# Patient Record
Sex: Female | Born: 1975 | Race: White | Hispanic: No | Smoking: Current some day smoker
Health system: Southern US, Community
[De-identification: ages and names within clinical notes are randomized; demographics above are authoritative.]

## PROBLEM LIST (undated history)

## (undated) DIAGNOSIS — K811 Chronic cholecystitis: Secondary | ICD-10-CM

## (undated) DIAGNOSIS — R140 Abdominal distension (gaseous): Secondary | ICD-10-CM

## (undated) DIAGNOSIS — R109 Unspecified abdominal pain: Secondary | ICD-10-CM

## (undated) DIAGNOSIS — K635 Polyp of colon: Secondary | ICD-10-CM

## (undated) DIAGNOSIS — R238 Other skin changes: Secondary | ICD-10-CM

## (undated) DIAGNOSIS — F419 Anxiety disorder, unspecified: Secondary | ICD-10-CM

## (undated) DIAGNOSIS — R079 Chest pain, unspecified: Secondary | ICD-10-CM

## (undated) DIAGNOSIS — T7840XA Allergy, unspecified, initial encounter: Secondary | ICD-10-CM

## (undated) DIAGNOSIS — N3010 Interstitial cystitis (chronic) without hematuria: Secondary | ICD-10-CM

## (undated) DIAGNOSIS — T8859XA Other complications of anesthesia, initial encounter: Secondary | ICD-10-CM

## (undated) DIAGNOSIS — S0300XA Dislocation of jaw, unspecified side, initial encounter: Secondary | ICD-10-CM

## (undated) DIAGNOSIS — M8580 Other specified disorders of bone density and structure, unspecified site: Secondary | ICD-10-CM

## (undated) DIAGNOSIS — B379 Candidiasis, unspecified: Secondary | ICD-10-CM

## (undated) DIAGNOSIS — K219 Gastro-esophageal reflux disease without esophagitis: Secondary | ICD-10-CM

## (undated) DIAGNOSIS — D126 Benign neoplasm of colon, unspecified: Secondary | ICD-10-CM

## (undated) DIAGNOSIS — K52839 Microscopic colitis, unspecified: Secondary | ICD-10-CM

## (undated) DIAGNOSIS — R519 Headache, unspecified: Secondary | ICD-10-CM

## (undated) DIAGNOSIS — K2970 Gastritis, unspecified, without bleeding: Secondary | ICD-10-CM

## (undated) DIAGNOSIS — R110 Nausea: Secondary | ICD-10-CM

## (undated) DIAGNOSIS — R51 Headache: Secondary | ICD-10-CM

## (undated) DIAGNOSIS — R233 Spontaneous ecchymoses: Secondary | ICD-10-CM

## (undated) DIAGNOSIS — N809 Endometriosis, unspecified: Secondary | ICD-10-CM

## (undated) DIAGNOSIS — T4145XA Adverse effect of unspecified anesthetic, initial encounter: Secondary | ICD-10-CM

## (undated) DIAGNOSIS — K5900 Constipation, unspecified: Secondary | ICD-10-CM

## (undated) DIAGNOSIS — R6883 Chills (without fever): Secondary | ICD-10-CM

## (undated) DIAGNOSIS — R0981 Nasal congestion: Secondary | ICD-10-CM

## (undated) DIAGNOSIS — R531 Weakness: Secondary | ICD-10-CM

## (undated) HISTORY — DX: Other skin changes: R23.8

## (undated) HISTORY — DX: Chest pain, unspecified: R07.9

## (undated) HISTORY — DX: Abdominal distension (gaseous): R14.0

## (undated) HISTORY — DX: Headache: R51

## (undated) HISTORY — DX: Unspecified abdominal pain: R10.9

## (undated) HISTORY — DX: Microscopic colitis, unspecified: K52.839

## (undated) HISTORY — DX: Gastritis, unspecified, without bleeding: K29.70

## (undated) HISTORY — DX: Polyp of colon: K63.5

## (undated) HISTORY — DX: Interstitial cystitis (chronic) without hematuria: N30.10

## (undated) HISTORY — PX: COLONOSCOPY: SHX174

## (undated) HISTORY — DX: Headache, unspecified: R51.9

## (undated) HISTORY — DX: Weakness: R53.1

## (undated) HISTORY — PX: WISDOM TOOTH EXTRACTION: SHX21

## (undated) HISTORY — DX: Benign neoplasm of colon, unspecified: D12.6

## (undated) HISTORY — PX: BILATERAL TEMPOROMANDIBULAR JOINT ARTHROPLASTY: SUR77

## (undated) HISTORY — DX: Nausea: R11.0

## (undated) HISTORY — PX: CHOLECYSTECTOMY: SHX55

## (undated) HISTORY — DX: Nasal congestion: R09.81

## (undated) HISTORY — DX: Chronic cholecystitis: K81.1

## (undated) HISTORY — PX: DIAGNOSTIC LAPAROSCOPY: SUR761

## (undated) HISTORY — PX: ABDOMINAL HYSTERECTOMY: SHX81

## (undated) HISTORY — DX: Anxiety disorder, unspecified: F41.9

## (undated) HISTORY — PX: LAPAROTOMY: SHX154

## (undated) HISTORY — DX: Other specified disorders of bone density and structure, unspecified site: M85.80

## (undated) HISTORY — DX: Allergy, unspecified, initial encounter: T78.40XA

## (undated) HISTORY — DX: Chills (without fever): R68.83

## (undated) HISTORY — DX: Constipation, unspecified: K59.00

## (undated) HISTORY — DX: Spontaneous ecchymoses: R23.3

## (undated) HISTORY — PX: BREAST SURGERY: SHX581

---

## 2000-08-22 ENCOUNTER — Encounter (INDEPENDENT_AMBULATORY_CARE_PROVIDER_SITE_OTHER): Payer: Self-pay

## 2000-08-22 ENCOUNTER — Observation Stay (HOSPITAL_COMMUNITY): Admission: RE | Admit: 2000-08-22 | Discharge: 2000-08-23 | Payer: Self-pay | Admitting: Obstetrics and Gynecology

## 2005-05-05 ENCOUNTER — Ambulatory Visit (HOSPITAL_COMMUNITY): Admission: RE | Admit: 2005-05-05 | Discharge: 2005-05-05 | Payer: Self-pay | Admitting: Obstetrics and Gynecology

## 2005-05-05 ENCOUNTER — Encounter (INDEPENDENT_AMBULATORY_CARE_PROVIDER_SITE_OTHER): Payer: Self-pay | Admitting: Specialist

## 2007-12-04 ENCOUNTER — Inpatient Hospital Stay (HOSPITAL_COMMUNITY): Admission: RE | Admit: 2007-12-04 | Discharge: 2007-12-06 | Payer: Self-pay | Admitting: Obstetrics and Gynecology

## 2007-12-04 ENCOUNTER — Encounter (INDEPENDENT_AMBULATORY_CARE_PROVIDER_SITE_OTHER): Payer: Self-pay | Admitting: Obstetrics and Gynecology

## 2010-06-09 NOTE — Op Note (Signed)
Janice Lyons, Janice Lyons            ACCOUNT NO.:  [id number]   MEDICAL RECORD NO.:  [id number]          PATIENT TYPE:  OIB   LOCATION:  9320                          FACILITY:  [facility]   PHYSICIAN:  Guy Sandifer. Henderson Cloud, M.D. DATE OF BIRTH:  15-Jun-1975   DATE OF PROCEDURE:  12/04/2007  DATE OF DISCHARGE:                               OPERATIVE REPORT   PREOPERATIVE DIAGNOSES:  1. Menorrhagia.  2. Pelvic pain.   POSTOPERATIVE DIAGNOSIS:  Severe endometriosis with extensive pelvic  adhesions.   PROCEDURES:  Total abdominal hysterectomy and extensive lysis of  adhesions.   SURGEON:  Guy Sandifer. Henderson Cloud, MD   ASSISTANT:  Dineen Kid. Rana Snare, MD   ANESTHESIA:  General with endotracheal intubation.   SPECIMENS:  Uterus to Pathology.   ESTIMATED BLOOD LOSS:  500 mL.   INDICATIONS AND CONSENT:  This patient is a 35 year old married white  female G1, P1 with known severe endometriosis.  She is status post left  salpingo-oophorectomy.  She has recurrent pelvic pain and heavy menses.  Details are dictated in the history and physical.  Total abdominal  hysterectomy has been discussed with the patient.  The patient wants to  preserve the right ovary if at all possible.  Potential risks and  complications have been discussed with the patient preoperatively  including but limited to infection, organ damage, bleeding requiring  transfusion of blood products with HIV and hepatitis acquisition, DVT,  PE, pneumonia, fistula formation, dyspareunia, and recurrent pain.  All  questions have been answered and consent was signed on the chart.  Dr.  Ihor Gully of Urology will place stents in the operating room.  The  patient also has a history of MRSA approximately 3 months ago.  Telephone consultation with Dr. Daiva Eves of Infectious Disease is  undertaken.  He recommends a pHisoHex shower and vancomycin and Ancef  antibiotic prophylaxis.  The patient did take her pHisoHex shower at  home.  All questions have  been answered and consent was signed on the  chart.   FINDINGS:  Upper abdomen palpates normally.  Uterus is normal in size.  Anterior cul-de-sac is normal.  Left tube and ovary are gone.  The right  ovary is densely adherent to the right pelvic sidewall.  There are  multiple adhesions of the small bowel to the posterior uterine fundus.   PROCEDURE:  The patient was taken to operating room where she was  identified and placed in the dorsal supine position and general  anesthesia was induced via endotracheal intubation.  Dr. Vernie Ammons then  places bilateral ureteral stents which was dictated under separate note.  Dr. Vernie Ammons secures these to the Foley catheter and leaves the room.  The patient was then prepped abdominally and vaginally and was draped in  the sterile fashion.  Skin was entered through the previous Pfannenstiel  scar.  Dissection was carried out in layers to the peritoneum.  Peritoneum was incised, extended superiorly and inferiorly.  Lenox Ahr retractor was placed.  The sides were padded with towels to  elevate the side retractors.  The blades were placed and the bowel was  packed away.  The adhesions to the posterior uterine fundus were taken  down with extensive sharp dissection.  The right ovary drains some small  amount of serous fluid from a small cyst.  There was a small amount of  bleeding on the right ovary, which was controlled with cautery.  After  dissecting the fundus and the lower uterine segment and the posterior  cul-de-sac free, the right round ligament was identified and ligated  with a 0 Monocryl suture.  All suture will be 0 Monocryl unless  otherwise designated.  This was then taken down and the proximal  ligaments were taken down as well.  The left round ligament was taken  down in a similar fashion.  The vesicouterine peritoneum anteriorly was  then taken down sharply and then bluntly advanced without difficulty.  The additional bites were  taken on the cardinal ligaments and the  uterine vessels were taken bilaterally.  The anterior vaginal fornix was  then entered by elevating with a Kocher clamps and entering with a  knife.  This was done with the bladder, dissected well away.  This was  successfully entered into the anterior vaginal fornix.  The uterus was  then cut free with Satinsky scissors in a 360-degree manner.  This  allows good visualization of the cervix and also maintains the point of  dissection at the top of the vaginal fornix all the way around.  After  delivering the specimen, the cuff was closed with interrupted figure-of-  eight.  Good hemostasis was obtained.  A single piece of Gelfoam was  placed in the posterior cul-de-sac.  Irrigation was carried out and  everything appears dry.  The angle sutures were tied in the midline.  The ureteral stents were palpated well clear of the areas of surgery.  The retractors and packs were then removed.  The anterior peritoneum was  closed in a running fashion with 0 Monocryl suture, which was also used  to reapproximate the pyramidalis muscle in the midline.  A single  interrupted suture was also used on the rectus to obtain complete  hemostasis.  Anterior rectus fascia was closed in a running fashion with  0 PDS suture using the loop suture.  The skin was closed with clips.  All sponge, instrument, and needle counts were correct and the patient  was transferred to recovery room in stable condition.      Guy Sandifer Henderson Cloud, M.D.  Electronically Signed     JET/MEDQ  D:  12/04/2007  T:  12/04/2007  Job:  [id number]

## 2010-06-09 NOTE — Discharge Summary (Signed)
[name], Janice Lyons            ACCOUNT NO.:  [id number]   MEDICAL RECORD NO.:  [id number]          PATIENT TYPE:  INP   LOCATION:  9320                          FACILITY:  [facility]   PHYSICIAN:  Guy Sandifer. Henderson Cloud, M.D. DATE OF BIRTH:  17-May-1975   DATE OF ADMISSION:  12/04/2007  DATE OF DISCHARGE:  12/06/2007                               DISCHARGE SUMMARY   ADMITTING DIAGNOSES:  Menorrhagia and pelvic pain.   DISCHARGE DIAGNOSES:  1. Severe endometriosis.  2. Pelvic adhesions.   PROCEDURE:  On December 04, 2007, total abdominal hysterectomy and  extensive lysis of adhesions.   REASON FOR ADMISSION:  This patient is a 35 year old married white  female G1, P1 with known severe endometriosis.  She has increasing pain.  She is admitted for surgical management.  Details are dictated in  history and physical.   HOSPITAL COURSE:  The patient was admitted to hospital and undergoes the  above procedure.  Estimated blood loss is 500 mL.  On the evening of  surgery she has good pain relief.  She is eating food.  Vital signs are  stable.  She is afebrile.  She is having some itching.  The urine output  is blood tinged, status post intraoperative stent placement, which was  removed at the end of the case.  First postoperative day, she has not  yet passed flatus, but tolerating a regular diet.  Vital signs are  stable.  She remains afebrile.  Hemoglobin is 11.0, and pathology is  pending.  On the day of discharge, she is passing flatus, had a bowel  movement, feeling much better.  Vital signs are stable and she is  afebrile.  Abdomen is flat and soft and incision is healing well.   CONDITION ON DISCHARGE:  Good.   DIET:  Regular as tolerated.   ACTIVITY:  No lifting, no operation of automobiles, no vaginal entry.   She is to call the office for problems including but not limited to  temperature of 101 degrees, persistent nausea, vomiting, heavy bleeding,  or increasing pain.  Follow up US  in the office in 2 weeks.   MEDICATIONS:  1. Percocet 5/325 mg #30 1-2 p.o. q.6 h. p.r.n.  2. Ibuprofen 600 mg q.6 h. p.r.n.  3. Multivitamin daily.  4. Bactrim DS #10 one p.o. b.i.d.      Guy Sandifer. Henderson Cloud, M.D.  Electronically Signed     JET/MEDQ  D:  12/06/2007  T:  12/07/2007  Job:  [id number]   cc:   Veverly Fells. Vernie Ammons, M.D.  Fax: [phone]

## 2010-06-09 NOTE — Op Note (Signed)
NAME:  Janice Lyons, Janice Lyons            ACCOUNT NO.:  [id number]   MEDICAL RECORD NO.:  [id number]          PATIENT TYPE:  OIB   LOCATION:  9320                          [facility]ACILITY:  [facility]   PHYSICIAN:  Mark C. Vernie Ammons, M.D.  DATE OF BIRTH:  [birth year]   DATE OF PROCEDURE:  12/04/2007  DATE OF DISCHARGE:                               OPERATIVE REPORT   PREOPERATIVE DIAGNOSIS:  Planned transabdominal hysterectomy for  endometriosis.   POSTOPERATIVE DIAGNOSIS:  Planned transabdominal hysterectomy for  endometriosis.   PROCEDURE:  1. Cystoscopy.  2. Bilateral internal-external stent placement.  3. Right retrograde pyelogram with interpretation.   SURGEON:  Mark C. Vernie Ammons, M.D.   ANESTHESIA:  General.   SPECIMENS:  None.   DRAINS:  1. A 16-French Foley catheter.  2. A 5-French open-ended ureteral catheter in the left ureter.  3. A 6-French open-ended ureteral catheter in the right ureter.   BLOOD LOSS:  None.   COMPLICATIONS:  None.   INDICATIONS:  The patient is a 35 year old female with severe  endometriosis.  She is scheduled undergo abdominal hysterectomy and  because of the extent of the disease, it is felt double-J stent  placement would be advantageous in allowing identification of the  ureters to prevent injury during the surgery.  I have discussed the  procedure with the patient as well as its risks and complications.  She  understands and elected to proceed.   DESCRIPTION OF OPERATION:  After informed consent, the patient was  brought to the major OR, placed on table administered general anesthesia  and moved to dorsal lithotomy position.  Her genitalia was sterilely  prepped and draped and an official time-out was then performed.   A 19-French rigid cystoscope with 30 degrees lens was then inserted in  the bladder and the bladder was fully inspected.  It was noted to be  free of any tumor, stones, or inflammatory lesions.  The ureteral  orifices were of normal  configuration and position.   A 5-French open-ended ureteral catheter was then passed through the  cystoscope and into the left ureteral orifice after, which a 0.038-inch  floppy tip guidewire was then passed up the left ureter under direct  fluoroscopic control into the area of the renal pelvis.  The open-ended  catheter was then passed into the area of the renal pelvis and the  guidewire was removed.  The open-ended stent was left in place, and the  cystoscope was removed and the right side addressed.   A 6-French open-ended ureteral catheter was then passed through the  cystoscope and into the right orifice.  I then passed the open-ended  stent partly up the ureter under direct fluoroscopy, but met with some  slight resistance, so I injected full strength contrast and performed  retrograde pyelogram.   Retrograde pyelogram on the right-hand side was performed by injecting  full strength contrast through the open-ended catheter under direct  fluoroscopic control.  It revealed a normal-appearing ureter with some  slight tortuosity caused by the advancement of the open-ended catheter  that then straightened out as I withdrew the catheter slightly.  The  collecting system was noted be completely normal with  no intrarenal  mass effect or filling defects.   The 0.038-inch floppy tip guidewire was then passed through the open-  ended catheter into the area of the renal pelvis and the open-ended  catheter was then passed into the renal pelvis and the guidewire was  removed.  I left that open-ended catheter on the right side and removed  the cystoscope.  I then inserted a 16-French Foley catheter and  connected both of the ureteral stents and the open-ended catheter to  closed system drainage.  The patient tolerated procedure well with no  intraoperative complications.   Dr. Henderson Cloud will then perform his surgery and this will be dictated  separately by him.  At the end of the operation,  he will then remove the  stents since they will no longer be needed.      Mark C. Vernie Ammons, M.D.  Electronically Signed     MCO/MEDQ  D:  12/04/2007  T:  12/04/2007  Job:  [id number]   cc:   Guy Sandifer. Henderson Cloud, M.D.  Fax: [phone]

## 2010-06-09 NOTE — H&P (Signed)
NAME:  Janice, Lyons            ACCOUNT NO.:  [id number]   MEDICAL RECORD NO.:  [id number]          PATIENT TYPE:  AMB   LOCATION:  SDC                           [facility]ACILITY:  [facility]   PHYSICIAN:  Guy Sandifer. Henderson Cloud, M.D. DATE OF BIRTH:  1975-02-28   DATE OF ADMISSION:  DATE OF DISCHARGE:                              HISTORY & PHYSICAL   CHIEF COMPLAINT:  Severe endometriosis.   HISTORY OF PRESENT ILLNESS:  This patient is a 35 year old married white  female G1, P1 with a well-documented history of severe endometriosis.  She has recurrent pelvic pain and heavy irregular menses.  She is status  post left salpingo-oophorectomy.  She has had recurrent pain and  irregular bleeding which has been refractory to conservative treatment.  Ultrasound in March of this year revealed a normal-appearing right ovary  except for a 2-cm cyst noted at that time.  Ultrasound of the uterus  appeared normal.  After discussion of options, she is being admitted for  total abdominal hysterectomy.  She wants to retain the right ovary if at  all possible.  Dr. Freida Busman will be placing stents prior to the  hysterectomy.  Potential risks and complications have been discussed  preoperatively.   PAST MEDICAL HISTORY:  1. Endometriosis  2. History of abnormal Pap smear.   PAST SURGICAL HISTORY:  1. TMJ surgery.  2. Breast left.  3. Laparotomy and multiple laparoscopies including left salpingo-      oophorectomy for endometriosis.   MEDICATIONS:  Nexium daily.  Loestrin 24.   ALLERGIES:  No known drug allergies.   OBSTETRICAL HISTORY:  Vaginal delivery x1.   SOCIAL HISTORY:  Denies tobacco, alcohol, or drug abuse.   FAMILY HISTORY:  Positive for arthritis, diabetes, thyroid cancer,  leukemia, and lung cancer.   REVIEW OF SYSTEMS:  NEUROLOGIC:  Denies headache.  No chest pain.  PULMONARY:  Denies shortness of breath.   PHYSICAL EXAMINATION:  VITAL SIGNS:  Height 5 feet 5 inches, weight 147  pounds, and  blood pressure 116/64.  HEENT:  Without thyromegaly.  LUNGS:  Clear to auscultation.  HEART:  Regular rate and rhythm.  BACK:  No CVA tenderness.  BREASTS:  No mass, traction, or discharge.  ABDOMEN:  Soft with mild tenderness in the lower quadrants without  rebound or masses.  PELVIC:  Both vagina and cervix are without lesion.  Uterus is normal size, mobile, and nontender.  Adnexa mildly tender  without palpable masses.  EXTREMITIES:  Grossly within normal limits.  NEUROLOGIC:  Grossly within normal limits.   ASSESSMENT:  Severe endometriosis with history of left salpingo-  oophorectomy now with recurrent pelvic pain and heavy irregular menses.   PLAN:  Total abdominal hysterectomy, possible right salpingo-  oophorectomy.      Guy Sandifer Henderson Cloud, M.D.  Electronically Signed     JET/MEDQ  D:  11/29/2007  T:  11/30/2007  Job:  [id number]

## 2010-06-12 NOTE — H&P (Signed)
[facility]  Patient:    NHU, GLASBY                     MRN: [id number] Adm. Date:  08/22/00 Attending:  Katherine Roan, M.D.                         History and Physical  CHIEF COMPLAINT:  Pelvic pain.  HISTORY OF PRESENT ILLNESS:  Daylee is a 35 year old, gravida 1, para 1 female who came to the office on 7/17, with pelvic pain and frequency. Examination revealed a large pelvic mass.  Ultrasound confirmed a large pelvic mass, probable endometriomal, and the patient is scheduled for laparoscopy, possibly laparotomy.  PAST MEDICAL HISTORY:  She has had "jaw surgery."  CURRENT MEDICATIONS:  Seraphim for PMS.  She had one uncomplicated delivery in 1998.  Her Pap smear shows low grade intraepithelial lesion, and this is to be worked up later.  REVIEW OF SYSTEMS:  HEENT:  She has no headaches or decrease in visual or auditory acuity.  HEART:  No history of hypertension, no rheumatic fever, no mitral valve prolapse, no shortness of breath, no history of hypertension.  LUNGS:  No chronic cough, no asthma, no hay fever.  GASTROURINARY:  No stress incontinence or frequent urinary tract infections. No history of hematuria.  GASTROINTESTINAL:  No bowel habit change, no weight loss or gain, no melena described.  MUSCLES, BONES, AND JOINTS:  No fractures or arthritis.  SOCIAL HISTORY:  She works regularly for [employer].  FAMILY HISTORY:  Mother and father both living and well.  She has one brother who is living and well.  Her father has a history of thyroid cancer, and a maternal grandfather has thyroid disease as well.  Her maternal grandmother has diabetes.  PHYSICAL EXAMINATION:  GENERAL:  Well-developed nourished pleasant female in no acute distress who appears to be her stated age of 28.  VITAL SIGNS:  Weight 146, blood pressure 108/60.  HEENT:  Unremarkable.  Oropharynx is not injected.  NECK:  Supple.  Thyroid is not  enlarged.  Carotid pulses are equal without bruits.  No adenopathy appreciated.  BREASTS:  No masses or tenderness.  LUNGS:  Clear to auscultation and percussion.  HEART:  Normal sinus rhythm, no murmurs.  ABDOMEN:  Soft and flat.  Liver, spleen, and kidneys are not palpated.  Bowel sounds are normal.  No bruits are heard.  EXTREMITIES:  Good range of motion and equal pulses and reflexes.  PELVIC:  Normal cervix, uterus is midplane, there is a large 10 cm cul-de-sac mass which is tender.  Rectovaginal confirms.  IMPRESSION:  Probable endometrioma.  PLAN:  Laparoscopy with possible laparotomy and ovarian cystectomy.  The risks and benefits have been discussed with the patient. DD:  08/22/00 TD:  08/22/00 Job: [id number]

## 2010-06-12 NOTE — Op Note (Signed)
[name], Lyons            ACCOUNT NO.:  [id number]   MEDICAL RECORD NO.:  [id number]          PATIENT TYPE:  AMB   LOCATION:  SDC                           FACILITY:  [facility]   PHYSICIAN:  Lenoard Aden, M.D.DATE OF BIRTH:  22-Oct-1975   DATE OF PROCEDURE:  05/05/2005  DATE OF DISCHARGE:                                 OPERATIVE REPORT   PREOPERATIVE DIAGNOSIS:  Pelvic pain.   POSTOPERATIVE DIAGNOSIS:  Large pelvic mass, probable recurrent  endometriosis.   SURGEON:  Lenoard Aden, M.D.   ASSISTANT:  Cordelia Pen A. Rosalio Macadamia, M.D.   PROCEDURE:  Diagnostic laparoscopy, lysis of adhesions, aspiration of large  left adnexal cyst.   Patient to recovery in good condition.   SPECIMENS:  Aspirate fluid, sent to pathology.   BRIEF OPERATIVE NOTE:  After being apprised of risks of anesthesia,  infection, bleeding, intra-abdominal organs need for repair, patient is  brought to the operating room.  She is administered general anesthetic  without complications.  Prepped and draped in the usual sterile fashion.  A  Foley catheter placed.  After achieving adequate anesthesia, dilute Marcaine  solution placed in the area of her previous infraumbilical incision.  At  this time, a curvilinear incision is made in the umbilicus.  A Veress needle  placed.  Opening pressure of -2 noted, 4 liters CO2 insufflated without  difficulty.  Trocar placed.  Visualization reveals atraumatic trocar entry.  Normal liver/gallbladder bed.  There is a small adhesion from the bowel  mesentery to the anterior abdominal wall along the right paracolic gutter.  At this time, attention is turned to the pelvis, whereby the uterus is  manipulated using Hulka tenaculum, which is placed prior to the procedure.  This reveals what appears to be a normal uterus and a normal anterior cul de  sac; however, there is a large hemorrhagic-appearing cyst that occupies the  entire cul de sac and appears to arise from the  left adnexa.  This is  contiguous with the sigmoid colon, as it traverses into the cul de sac.  There is no plane between this cyst and the left adnexal wall, the uterus,  or the bowel.  It is contiguous of all of these structures.  Two 5 mm  trocars are placed bilaterally under direct visualization, and blunt  dissection is used to visualize part of the posterior cul de sac.  In  addition, there is a right tubal fimbria, which is noted.  There is a  vesiculated structure which comes off this mass, which appears to be an  abnormal fimbria from the left tube.  At this time, the right ovary and  right tube are unable to be completely identified.  There is no way to  visualize the left tube or ovary at all.  The decision is made due to the  patient's pain and the size of this mass to aspirate this mass, given her  history of endometriosis; therefore, needle probe is entered through the 5  mm trocar into the midline of this structure and 120 cc of hemorrhagic-  appearing fluid is aspirated from the  structure and sent to pathology.  At  this time, minimal to no bleeding was noted from the puncture site.  Irrigation is accomplished.  All instruments are removed under direct  visualization after CO2 is released.  Incisions are closed using Dermabond,  0 Vicryl, and a 4-0 Vicryl.  The patient tolerated the procedure well.  Instruments are removed from the vagina.  Foley catheter removed.  She is  transferred to recovery in good condition.   Please note that the patient was not consented for laparotomy or  adnexectomy.  Under these circumstances, it is felt that due to the  significant nature of these adhesions, adhesive disease, that the patient  will need a bowel prep, laparotomy, and possible adnexectomy.      Lenoard Aden, M.D.  Electronically Signed     RJT/MEDQ  D:  05/05/2005  T:  05/05/2005  Job:  [id number]

## 2010-06-12 NOTE — Op Note (Signed)
[facility]  Patient:    Janice Lyons, Janice Lyons                     MRN: [id number] Proc. Date: 08/22/00 Attending:  Katherine Roan, M.D.                           Operative Report  PREOPERATIVE DIAGNOSES:  Pelvic pain and large ovarian cyst.  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  OPERATION PERFORMED:  Laparoscopy with laparotomy, right ovarian cystectomy (excision of endometrioma, excision of endometriosis of surface of left ovary).  DESCRIPTION OF PROCEDURE:  The patient was placed in lithotomy position and prepped and draped in the usual fashion. A Foley catheter was inserted, a transverse umbilical incision was made in the abdomen and the Veress needle was inserted. Aspiration infusion technique was used. The abdomen was then distended with carbon dioxide. A Hulka elevator was inserted into the uterus. Visualization of the pelvis revealed a large cul-de-sac endometriosis stuck with dense adhesions to the cul-de-sac and colon. The left ovary appeared normal, both tubes were normal. The upper abdomen was normal. It was decided at that time, open laparotomy was then thing to do. A transverse incision was made in the abdomen, hemostasis with the Bovie. The fascia was incised transversely and the peritoneum opened vertically. Exploration of the upper abdomen was normal. Visualization of the pelvis revealed a socked in endometrioma of the right ovary. On freeing up this lesion, it ruptured and the endometrioma was excised from the ovary. Then the ovary was sewn with interrupted 3-0, 4-0 Vicryl along with a continuous 4-0 Vicryl. Surface endometriosis on the left ovary was then excised very carefully and closed with 4-0 Vicryl. The appendix was visualized and normal. The parietal irrigation accomplished and hemostasis was meticulously obtained. The parietoperitoneum was closed with 2-0 PDS, the fascia was closed  with 2-0 PDS, the skin was closed with 4-0 PDS. A dry sterile dressing was applied. Janice Lyons tolerated this procedure well and was sent to the recovery room in good condition. DD:  08/22/00 TD:  08/22/00 Job: [id number]

## 2010-10-27 LAB — COMPREHENSIVE METABOLIC PANEL
ALT: 15
AST: 17
BUN: 6
CO2: 25
Chloride: 105
Creatinine, Ser: 0.71
Potassium: 3.6

## 2010-10-27 LAB — HCG, SERUM, QUALITATIVE: Preg, Serum: NEGATIVE

## 2010-10-27 LAB — CBC
HCT: 32.1 — ABNORMAL LOW
HCT: 42.3
Hemoglobin: 14.3
MCHC: 33.8
Platelets: 193
RBC: 3.54 — ABNORMAL LOW
WBC: 6.9

## 2011-02-04 ENCOUNTER — Ambulatory Visit (INDEPENDENT_AMBULATORY_CARE_PROVIDER_SITE_OTHER): Payer: PRIVATE HEALTH INSURANCE

## 2011-02-04 DIAGNOSIS — R300 Dysuria: Secondary | ICD-10-CM

## 2011-04-24 ENCOUNTER — Ambulatory Visit (INDEPENDENT_AMBULATORY_CARE_PROVIDER_SITE_OTHER): Payer: PRIVATE HEALTH INSURANCE | Admitting: Family Medicine

## 2011-04-24 VITALS — BP 101/59 | HR 79 | Temp 98.4°F | Resp 16 | Ht 64.5 in | Wt 156.0 lb

## 2011-04-24 DIAGNOSIS — R300 Dysuria: Secondary | ICD-10-CM

## 2011-04-24 LAB — POCT UA - MICROSCOPIC ONLY
Crystals, Ur, HPF, POC: NEGATIVE
Mucus, UA: NEGATIVE
RBC, urine, microscopic: NEGATIVE
Yeast, UA: NEGATIVE

## 2011-04-24 LAB — POCT URINALYSIS DIPSTICK
Bilirubin, UA: NEGATIVE
Blood, UA: NEGATIVE
Glucose, UA: NEGATIVE
Nitrite, UA: NEGATIVE
Spec Grav, UA: 1.015
Urobilinogen, UA: 0.2
pH, UA: 5.5

## 2011-04-24 MED ORDER — CIPROFLOXACIN HCL 250 MG PO TABS: 250.0000 mg | ORAL_TABLET | Freq: Two times a day (BID) | ORAL | Status: AC

## 2011-04-24 MED ORDER — FLUCONAZOLE 150 MG PO TABS: ORAL_TABLET | ORAL | Status: AC

## 2011-04-24 NOTE — Progress Notes (Signed)
  Subjective:    Patient ID: Janice Lyons, female    DOB: November 09, 1975, 36 y.o.   MRN: [id number]  Urinary Tract Infection  The current episode started in the past 7 days. The problem occurs every urination. The problem has been gradually worsening. The quality of the pain is described as burning. The pain is at a severity of 8/10. The pain is moderate. There has been no fever. There is no history of pyelonephritis. Associated symptoms include flank pain (L). She has tried acetaminophen and NSAIDs (AZO) for the symptoms. Her past medical history is significant for recurrent UTIs (5 UTI's in the last year). There is no history of kidney stones.  Flank Pain   PMH/ GERD           S/P TAH 2000(endometriosis)  Review of Systems  Genitourinary: Positive for flank pain (L).       Objective:   Physical Exam  Constitutional: She appears well-developed.  HENT:  Mouth/Throat: Oropharynx is clear and moist.  Neck: Neck supple.  Cardiovascular: Normal rate, regular rhythm and normal heart sounds.   Pulmonary/Chest: Effort normal and breath sounds normal.  Abdominal: Soft. There is tenderness (suprapubic). There is CVA tenderness.  Neurological: She is alert.  Skin: Skin is warm.      Results for orders placed in visit on 04/24/11  POCT UA - MICROSCOPIC ONLY      Component Value Range   WBC, Ur, HPF, POC 0-4     RBC, urine, microscopic neg     Bacteria, U Microscopic 3+     Mucus, UA neg     Epithelial cells, urine per micros 0-4     Crystals, Ur, HPF, POC neg     Casts, Ur, LPF, POC neg     Yeast, UA neg    POCT URINALYSIS DIPSTICK      Component Value Range   Color, UA yellow     Clarity, UA clear     Glucose, UA neg     Bilirubin, UA neg     Ketones, UA 15mg      Spec Grav, UA 1.015     Blood, UA neg     pH, UA 5.5     Protein, UA neg     Urobilinogen, UA 0.2     Nitrite, UA neg     Leukocytes, UA Negative           Assessment & Plan:   1. Dysuria  POCT UA -  Microscopic Only, POCT Urinalysis Dipstick, Urine culture   See medications prescribed on AVS If symptoms persist call or RTC Anticipatory guidance

## 2011-04-25 LAB — URINE CULTURE: Organism ID, Bacteria: NO GROWTH

## 2011-08-20 ENCOUNTER — Inpatient Hospital Stay (HOSPITAL_COMMUNITY): Payer: PRIVATE HEALTH INSURANCE

## 2011-08-20 ENCOUNTER — Inpatient Hospital Stay (HOSPITAL_COMMUNITY)
Admission: AD | Admit: 2011-08-20 | Discharge: 2011-08-20 | Disposition: A | Discharge status: Home / Self Care | Payer: PRIVATE HEALTH INSURANCE | Source: Ambulatory Visit | Attending: Obstetrics and Gynecology | Admitting: Obstetrics and Gynecology

## 2011-08-20 ENCOUNTER — Other Ambulatory Visit (HOSPITAL_COMMUNITY): Payer: Self-pay | Admitting: Obstetrics and Gynecology

## 2011-08-20 ENCOUNTER — Encounter (HOSPITAL_COMMUNITY): Payer: Self-pay | Admitting: *Deleted

## 2011-08-20 DIAGNOSIS — R109 Unspecified abdominal pain: Secondary | ICD-10-CM | POA: Insufficient documentation

## 2011-08-20 DIAGNOSIS — R142 Eructation: Secondary | ICD-10-CM | POA: Insufficient documentation

## 2011-08-20 DIAGNOSIS — R140 Abdominal distension (gaseous): Secondary | ICD-10-CM

## 2011-08-20 DIAGNOSIS — N83201 Unspecified ovarian cyst, right side: Secondary | ICD-10-CM

## 2011-08-20 DIAGNOSIS — R52 Pain, unspecified: Secondary | ICD-10-CM

## 2011-08-20 DIAGNOSIS — R143 Flatulence: Secondary | ICD-10-CM

## 2011-08-20 DIAGNOSIS — R141 Gas pain: Secondary | ICD-10-CM

## 2011-08-20 HISTORY — DX: Dislocation of jaw, unspecified side, initial encounter: S03.00XA

## 2011-08-20 HISTORY — DX: Candidiasis, unspecified: B37.9

## 2011-08-20 HISTORY — DX: Endometriosis, unspecified: N80.9

## 2011-08-20 LAB — URINALYSIS, ROUTINE W REFLEX MICROSCOPIC
Ketones, ur: NEGATIVE mg/dL
Nitrite: NEGATIVE
Protein, ur: NEGATIVE mg/dL

## 2011-08-20 LAB — URINE MICROSCOPIC-ADD ON

## 2011-08-20 LAB — CBC
HCT: 39.1 % (ref 36.0–46.0)
WBC: 7.1 10*3/uL (ref 4.0–10.5)

## 2011-08-20 LAB — COMPREHENSIVE METABOLIC PANEL
Alkaline Phosphatase: 35 U/L — ABNORMAL LOW (ref 39–117)
BUN: 9 mg/dL (ref 6–23)
CO2: 27 mEq/L (ref 19–32)
Calcium: 9.8 mg/dL (ref 8.4–10.5)
Total Protein: 7 g/dL (ref 6.0–8.3)

## 2011-08-20 MED ORDER — IOHEXOL 300 MG/ML  SOLN
100.0000 mL | Freq: Once | INTRAMUSCULAR | Status: AC | PRN
  Administered 2011-08-20: 100 mL via INTRAVENOUS

## 2011-08-20 MED ORDER — SIMETHICONE 80 MG PO CHEW
160.0000 mg | CHEWABLE_TABLET | Freq: Once | ORAL | Status: AC
  Administered 2011-08-20: 160 mg via ORAL
  Filled 2011-08-20: qty 2

## 2011-08-20 MED ORDER — SODIUM CHLORIDE 0.9 % IV SOLN
Freq: Once | INTRAVENOUS | Status: AC
  Administered 2011-08-20: 18:00:00 via INTRAVENOUS

## 2011-08-20 NOTE — MAU Note (Signed)
Started experiencing abdominal pain last week on her vacation[place] to [place] GYN MD; seen by Dr Henderson Cloud 2 days ago and diagnosed with ovarian cyst and abdominal mass; scheduled for CT scan on Monday; increase in pain in past 2 days and increased bloating; inability to pass gas unless she is lying on her R side; if pt eats anything- the pain is increased; States that she is okay waiting for her ct scan until Monday if something can be doneabout her bloating and pain;

## 2011-08-20 NOTE — MAU Provider Note (Signed)
History     CSN: [id number]  Arrival date and time: 08/20/11 1352   First Provider Initiated Contact with Patient 08/20/11 1640      Chief Complaint  Patient presents with  . Abdominal Pain  . Bloated   HPI  Started experiencing abdominal pain last week on her vacation-[place] and informed to follow-up with her GYN MD for a cyst;  Seen by Dr Henderson Cloud 2 days ago and diagnosed with ovarian cyst and abdominal mass; scheduled for CT scan on Monday;  However, pt called with an increase in pain in past 2 days and increased bloating; Difficulty in passing gas unless she is lying on her R side.  Pain increases if pt eats anything.   Past Medical History  Diagnosis Date  . TMJ (dislocation of temporomandibular joint)   . Endometriosis   . Reflux   . Yeast infection     Past Surgical History  Procedure Date  . Abdominal hysterectomy   . Bilateral temporomandibular joint arthroplasty   . Breast surgery   . Wisdom tooth extraction     Family History  Problem Relation Age of Onset  . Other Neg Hx     History  Substance Use Topics  . Smoking status: Former Games developer  . Smokeless tobacco: Not on file  . Alcohol Use: No    Allergies: No Known Allergies  Prescriptions prior to admission  Medication Sig Dispense Refill  . esomeprazole (NEXIUM) 40 MG capsule Take 40 mg by mouth daily before breakfast.      . loperamide (IMODIUM) 2 MG capsule Take 2 mg by mouth 4 (four) times daily as needed. For diarrhea      . ondansetron (ZOFRAN-ODT) 4 MG disintegrating tablet Take 4-8 mg by mouth every 8 (eight) hours as needed. For nausea/vomiting      . simethicone (MYLICON) 125 MG chewable tablet Chew 125 mg by mouth every 6 (six) hours as needed. For gas/constipation      . traMADol (ULTRAM) 50 MG tablet Take 50 mg by mouth every 6 (six) hours as needed. For pain        Review of Systems  Gastrointestinal: Positive for abdominal pain and constipation.       Bloating    All other systems reviewed and are negative.   Physical Exam   Blood pressure 106/71, pulse 80, temperature 98.4 F (36.9 C), temperature source Oral, resp. rate 16, height 5\' 5"  (1.651 m), weight 69.4 kg (153 lb), SpO2 100.00%.  Physical Exam  Constitutional: She is oriented to person, place, and time. She appears well-developed and well-nourished. No distress.  HENT:  Head: Normocephalic.  Neck: Normal range of motion. Neck supple.  Cardiovascular: Normal rate, regular rhythm and normal heart sounds.   Respiratory: Effort normal and breath sounds normal. No respiratory distress.  GI: She exhibits distension. There is tenderness.       Hyperactive bowel sounds  Musculoskeletal: Normal range of motion. She exhibits no edema.  Neurological: She is alert and oriented to person, place, and time. She has normal reflexes.  Skin: Skin is warm and dry.    MAU Course  Procedures  1658 Consulted with Dr. Henderson Cloud and reviewed lab results and exam > order CT scan of abd/pelvis with PO and IV contrast  Results for orders placed during the hospital encounter of 08/20/11 (from the past 24 hour(s))  URINALYSIS, ROUTINE W REFLEX MICROSCOPIC     Status: Abnormal   Collection Time  08/20/11  3:27 PM      Component Value Range   Color, Urine YELLOW  YELLOW   APPearance HAZY (*) CLEAR   Specific Gravity, Urine 1.015  1.005 - 1.030   pH 6.0  5.0 - 8.0   Glucose, UA NEGATIVE  NEGATIVE mg/dL   Hgb urine dipstick TRACE (*) NEGATIVE   Bilirubin Urine NEGATIVE  NEGATIVE   Ketones, ur NEGATIVE  NEGATIVE mg/dL   Protein, ur NEGATIVE  NEGATIVE mg/dL   Urobilinogen, UA 0.2  0.0 - 1.0 mg/dL   Nitrite NEGATIVE  NEGATIVE   Leukocytes, UA NEGATIVE  NEGATIVE  URINE MICROSCOPIC-ADD ON     Status: Abnormal   Collection Time   08/20/11  3:27 PM      Component Value Range   Squamous Epithelial / LPF MANY (*) RARE   WBC, UA 0-2  <3 WBC/hpf   Bacteria, UA FEW (*) RARE   Urine-Other MUCOUS PRESENT    CBC      Status: Normal   Collection Time   08/20/11  3:30 PM      Component Value Range   WBC 7.1  4.0 - 10.5 K/uL   RBC 4.40  3.87 - 5.11 MIL/uL   Hemoglobin 12.9  12.0 - 15.0 g/dL   HCT 40.9  81.1 - 91.4 %   MCV 88.9  78.0 - 100.0 fL   MCH 29.3  26.0 - 34.0 pg   MCHC 33.0  30.0 - 36.0 g/dL   RDW 78.2  95.6 - 21.3 %   Platelets 177  150 - 400 K/uL  COMPREHENSIVE METABOLIC PANEL     Status: Abnormal   Collection Time   08/20/11  3:30 PM      Component Value Range   Sodium 137  135 - 145 mEq/L   Potassium 3.9  3.5 - 5.1 mEq/L   Chloride 101  96 - 112 mEq/L   CO2 27  19 - 32 mEq/L   Glucose, Bld 85  70 - 99 mg/dL   BUN 9  6 - 23 mg/dL   Creatinine, Ser 0.86  0.50 - 1.10 mg/dL   Calcium 9.8  8.4 - 57.8 mg/dL   Total Protein 7.0  6.0 - 8.3 g/dL   Albumin 4.2  3.5 - 5.2 g/dL   AST 14  0 - 37 U/L   ALT 10  0 - 35 U/L   Alkaline Phosphatase 35 (*) 39 - 117 U/L   Total Bilirubin 0.5  0.3 - 1.2 mg/dL   GFR calc non Af Amer >90  >90 mL/min   GFR calc Af Amer >90  >90 mL/min   Ultrasound:   IMPRESSION:  Normal appendix. No acute intra-abdominal pathology. Cystic  changes in the right ovary are suspected.  Consult with Dr. Henderson Cloud > reviewed lab results and CT results > pt may be discharged home and will provide referral as discussed with pt  Assessment and Plan  Abdominal Bloating  Plan: DC to home Follow-up in office as discussed  [facility] 08/20/2011, 4:42 PM

## 2011-08-20 NOTE — MAU Note (Signed)
consistant Bloating since Sunday- been sick for 23days.  Not hardly eating because of how she feels.  If lays on rt side, can pass, has taken max for gasx.  Called dr Henderson Cloud- was seen 07/24- 2 cysts and a mass, was told to come in due to increased discomfort. Had hystercomy 4 yrs ago-only has rt ovary remaining.

## 2011-08-23 ENCOUNTER — Ambulatory Visit (HOSPITAL_COMMUNITY): Payer: PRIVATE HEALTH INSURANCE

## 2011-09-01 DIAGNOSIS — R197 Diarrhea, unspecified: Secondary | ICD-10-CM | POA: Insufficient documentation

## 2011-09-01 DIAGNOSIS — K5900 Constipation, unspecified: Secondary | ICD-10-CM | POA: Insufficient documentation

## 2011-09-01 DIAGNOSIS — N809 Endometriosis, unspecified: Secondary | ICD-10-CM | POA: Insufficient documentation

## 2011-09-02 ENCOUNTER — Encounter: Payer: Self-pay | Admitting: Internal Medicine

## 2011-10-04 ENCOUNTER — Encounter: Payer: Self-pay | Admitting: Internal Medicine

## 2011-10-05 ENCOUNTER — Ambulatory Visit: Payer: PRIVATE HEALTH INSURANCE | Admitting: Internal Medicine

## 2011-10-27 ENCOUNTER — Other Ambulatory Visit: Payer: Self-pay | Admitting: *Deleted

## 2011-11-01 ENCOUNTER — Encounter: Payer: Self-pay | Admitting: Internal Medicine

## 2011-11-02 ENCOUNTER — Ambulatory Visit (INDEPENDENT_AMBULATORY_CARE_PROVIDER_SITE_OTHER): Payer: PRIVATE HEALTH INSURANCE | Admitting: Internal Medicine

## 2011-11-02 ENCOUNTER — Encounter: Payer: Self-pay | Admitting: Internal Medicine

## 2011-11-02 ENCOUNTER — Ambulatory Visit: Payer: PRIVATE HEALTH INSURANCE | Admitting: Internal Medicine

## 2011-11-02 VITALS — BP 100/64 | HR 68 | Ht 66.0 in | Wt 157.6 lb

## 2011-11-02 DIAGNOSIS — K219 Gastro-esophageal reflux disease without esophagitis: Secondary | ICD-10-CM

## 2011-11-02 DIAGNOSIS — R143 Flatulence: Secondary | ICD-10-CM

## 2011-11-02 DIAGNOSIS — R197 Diarrhea, unspecified: Secondary | ICD-10-CM

## 2011-11-02 DIAGNOSIS — Z90710 Acquired absence of both cervix and uterus: Secondary | ICD-10-CM | POA: Insufficient documentation

## 2011-11-02 DIAGNOSIS — Z8742 Personal history of other diseases of the female genital tract: Secondary | ICD-10-CM | POA: Insufficient documentation

## 2011-11-02 DIAGNOSIS — R141 Gas pain: Secondary | ICD-10-CM

## 2011-11-02 DIAGNOSIS — R140 Abdominal distension (gaseous): Secondary | ICD-10-CM

## 2011-11-02 DIAGNOSIS — R1013 Epigastric pain: Secondary | ICD-10-CM

## 2011-11-02 DIAGNOSIS — N3010 Interstitial cystitis (chronic) without hematuria: Secondary | ICD-10-CM

## 2011-11-02 MED ORDER — DEXLANSOPRAZOLE 60 MG PO CPDR: 60.0000 mg | DELAYED_RELEASE_CAPSULE | Freq: Every day | ORAL | Status: DC

## 2011-11-02 NOTE — Progress Notes (Signed)
Patient ID: Janice Lyons, female   DOB: 1975/03/31, 36 y.o.   MRN: [id number]  SUBJECTIVE: HPI Mrs. Melerine is a 36 year old female with a past medical history of endometriosis status post hysterectomy, interstitial cystitis, GERD who seen in consultation at the request of Dr. Ludwig Clarks for evaluation of abd pain/bloating/GERD.  The patient reports her current issues date back to early July 2013. Prior to this she reports a history of endometriosis requiring total abdominal hysterectomy except for an intact right ovary. Then, in July she developed right-sided abdominal pain and diarrhea which lasted one month. This started during a vacation and she was seen in an emergency room in [place], [place]. CT scan was done there and she was told she did not have appendicitis but did have a right ovarian cyst. She saw her GYN physician and an ultrasound was done confirming right ovarian cysts and she was referred for oophorectomy at [facility].  When she was seen in consultation in [facility], he wanted her to have a GI evaluation first. In the interim her diarrhea has resolved. She did take one month of a probiotic, Restora which helped regulate her bowel movements. She ran of this medication, and has now noticed mild constipation. No rectal bleeding or melena. She does report significant abdominal bloating which is present on most days. This is worse after eating and absent when she wakes up. She also noticed frequent borborygmi and occasional mid abdominal "ache".  Also over the last several months her heartburn has worsened despite Nexium therapy. Initially Nexium worked well for her, but now I does not seem to. She has near daily heartburn and at times epigastric abdominal pain. She denies dysphagia or odynophagia. She has been using Pepcid and TUMS for breakthrough symptoms. She reports occasionally the only thing that we'll help her heartburn is to not eat for a whole day.  She's tried simethicone for  the bloating without much benefit. No fevers or chills.  The patient also has a history of suprapubic pain and was diagnosed with interstitial cystitis. She is under the care of her urologist and has had bladder infusions which has helped significantly. Overall her interstitial cystitis is well-controlled at this point, with resolution of her suprapubic and lower back pain.  Review of Systems  As per history of present illness, otherwise negative   Past Medical History  Diagnosis Date  . TMJ (dislocation of temporomandibular joint)   . Endometriosis   . Reflux   . Yeast infection   . Interstitial cystitis     Current Outpatient Prescriptions  Medication Sig Dispense Refill  . DiphenhydrAMINE HCl (BENADRYL ALLERGY PO) Take 1 tablet by mouth daily.      . famotidine (PEPCID) 20 MG tablet Take 20 mg by mouth as needed.      Marland Kitchen guaiFENesin (MUCINEX) 600 MG 12 hr tablet Take 1,200 mg by mouth 2 (two) times daily.      Marland Kitchen loperamide (IMODIUM) 2 MG capsule Take 2 mg by mouth 4 (four) times daily as needed. For diarrhea      . ondansetron (ZOFRAN-ODT) 4 MG disintegrating tablet Take 4-8 mg by mouth every 8 (eight) hours as needed. For nausea/vomiting      . simethicone (MYLICON) 125 MG chewable tablet Chew 125 mg by mouth every 6 (six) hours as needed. For gas/constipation      . traMADol (ULTRAM) 50 MG tablet Take 50 mg by mouth every 6 (six) hours as needed. For pain      .  dexlansoprazole (DEXILANT) 60 MG capsule Take 1 capsule (60 mg total) by mouth daily.  90 capsule  3    Not on File  Family History  Problem Relation Age of Onset  . Diabetes Maternal Grandfather   . Thyroid cancer Father   . Leukemia Maternal Grandfather   . Hypertension Mother   . Stroke Father     History  Substance Use Topics  . Smoking status: Former Smoker    Quit date: 11/01/1993  . Smokeless tobacco: Not on file  . Alcohol Use: Yes     rarely    OBJECTIVE: BP 100/64  Pulse 68  Ht 5\' 6"  (1.676 m)   Wt 157 lb 9.6 oz (71.487 kg)  BMI 25.44 kg/m2  SpO2 99% Constitutional: Well-developed and well-nourished. No distress. HEENT: Normocephalic and atraumatic. Oropharynx is clear and moist. No oropharyngeal exudate. Conjunctivae are normal. Pupils are equal round and reactive to light. No scleral icterus. Neck: Neck supple. Trachea midline. Cardiovascular: Normal rate, regular rhythm and intact distal pulses. No M/R/G Pulmonary/chest: Effort normal and breath sounds normal. No wheezing, rales or rhonchi. Abdominal: Soft, mild epigastric tenderness without rebound or guarding, nondistended. Bowel sounds active throughout. There are no masses palpable. No hepatosplenomegaly. Extremities: no clubbing, cyanosis, or edema Lymphadenopathy: No cervical adenopathy noted. Neurological: Alert and oriented to person place and time. Skin: Skin is warm and dry. No rashes noted. Psychiatric: Normal mood and affect. Behavior is normal.  Labs and Imaging -- CBC    Component Value Date/Time   WBC 7.1 08/20/2011 1530   RBC 4.40 08/20/2011 1530   HGB 12.9 08/20/2011 1530   HCT 39.1 08/20/2011 1530   PLT 177 08/20/2011 1530   MCV 88.9 08/20/2011 1530   MCH 29.3 08/20/2011 1530   MCHC 33.0 08/20/2011 1530   RDW 12.9 08/20/2011 1530    CMP     Component Value Date/Time   NA 137 08/20/2011 1530   K 3.9 08/20/2011 1530   CL 101 08/20/2011 1530   CO2 27 08/20/2011 1530   GLUCOSE 85 08/20/2011 1530   BUN 9 08/20/2011 1530   CREATININE 0.76 08/20/2011 1530   CALCIUM 9.8 08/20/2011 1530   PROT 7.0 08/20/2011 1530   ALBUMIN 4.2 08/20/2011 1530   AST 14 08/20/2011 1530   ALT 10 08/20/2011 1530   ALKPHOS 35* 08/20/2011 1530   BILITOT 0.5 08/20/2011 1530   GFRNONAA >90 08/20/2011 1530   GFRAA >90 08/20/2011 1530    CT ABDOMEN AND PELVIS WITH CONTRAST 08/20/2011   Technique:  Multidetector CT imaging of the abdomen and pelvis was performed following the standard protocol during bolus administration of intravenous  contrast.   Contrast: OMNIPAQUE IOHEXOL 300 MG/ML  SOLN   Comparison: None.   Findings: Normal appendix on image 63.   Uterus is absent.  Bladder and left adnexa are unremarkable.  Right ovary is 3.7 x 2.8 cm and heterogeneous likely due to cystic areas.   Tiny nonspecific hypodensities are present in the liver which are nonspecific. They are likely benign.   Gallbladder, spleen, pancreas, adrenal glands, kidneys are within normal limits.  Free fluid.  No abnormal adenopathy.   No destructive bone lesion.   IMPRESSION: Normal appendix.  No acute intra-abdominal pathology.  Cystic changes in the right ovary are suspected.   ASSESSMENT AND PLAN: 36 year old female with a past medical history of endometriosis status post hysterectomy, interstitial cystitis, GERD who seen in consultation at the request of Dr. Ludwig Clarks for evaluation of abd  pain/bloating/GERD.   1.  Epigastric and midabdominal pain/abdominal bloating/GERD -- I recommended upper endoscopy for further evaluation the patient's symptoms given the now lack of response to PPI therapy. EGD will also allow for small bowel biopsies to exclude celiac disease. Also perform a stool test for ova and parasite to exclude infection such as Giardia. I will switch her Nexium to Dexilant 60 mg daily. She is instructed to take this 30 minutes to one hour before her first meal of the day.  Given her great response to the probiotic, Restora, I recommend she resume this one capsule daily. If she cannot find this OTC, then I have recommended Align one capsule daily.  We have also discussed a low bloating diet as well as the possibility of small bowel bacterial overgrowth. I will hold off on any therapy for SIBO, until the endoscopy results are available.  Further recommendations after procedure.

## 2011-11-02 NOTE — Patient Instructions (Addendum)
You have been scheduled for an endoscopy with propofol. Please follow written instructions given to you at your visit today. If you use inhalers (even only as needed), please bring them with you on the day of your procedure.   Your physician has requested that you go to the basement for  lab work before leaving today:  Continue taking Restora, if you cannot find that start taking Align; 1 capsul daily  We have sent the following medications to your pharmacy for you to pick up at your convenience: Dexilant

## 2011-11-02 NOTE — Progress Notes (Deleted)
Patient ID: Janice Lyons, female   DOB: 1975-09-04, 36 y.o.   MRN: [id number]

## 2011-11-03 ENCOUNTER — Encounter: Payer: Self-pay | Admitting: Internal Medicine

## 2011-11-04 ENCOUNTER — Other Ambulatory Visit: Payer: PRIVATE HEALTH INSURANCE

## 2011-11-04 DIAGNOSIS — R1013 Epigastric pain: Secondary | ICD-10-CM

## 2011-11-04 DIAGNOSIS — R197 Diarrhea, unspecified: Secondary | ICD-10-CM

## 2011-11-05 ENCOUNTER — Other Ambulatory Visit: Payer: Self-pay | Admitting: Internal Medicine

## 2011-11-05 LAB — FECAL LACTOFERRIN, QUANT: Lactoferrin: POSITIVE

## 2011-11-10 ENCOUNTER — Encounter: Payer: Self-pay | Admitting: Internal Medicine

## 2011-11-10 ENCOUNTER — Ambulatory Visit (AMBULATORY_SURGERY_CENTER): Payer: PRIVATE HEALTH INSURANCE | Admitting: Internal Medicine

## 2011-11-10 VITALS — BP 110/69 | HR 69 | Temp 97.4°F | Resp 18 | Ht 66.0 in | Wt 157.0 lb

## 2011-11-10 DIAGNOSIS — K219 Gastro-esophageal reflux disease without esophagitis: Secondary | ICD-10-CM

## 2011-11-10 DIAGNOSIS — R141 Gas pain: Secondary | ICD-10-CM

## 2011-11-10 DIAGNOSIS — R197 Diarrhea, unspecified: Secondary | ICD-10-CM

## 2011-11-10 DIAGNOSIS — D1330 Benign neoplasm of unspecified part of small intestine: Secondary | ICD-10-CM

## 2011-11-10 DIAGNOSIS — R1013 Epigastric pain: Secondary | ICD-10-CM

## 2011-11-10 DIAGNOSIS — K319 Disease of stomach and duodenum, unspecified: Secondary | ICD-10-CM

## 2011-11-10 DIAGNOSIS — R140 Abdominal distension (gaseous): Secondary | ICD-10-CM

## 2011-11-10 MED ORDER — SODIUM CHLORIDE 0.9 % IV SOLN: 500.0000 mL | INTRAVENOUS | Status: DC

## 2011-11-10 NOTE — Progress Notes (Signed)
Patient did not experience any of the following events: a burn prior to discharge; a fall within the facility; wrong site/side/patient/procedure/implant event; or a hospital transfer or hospital admission upon discharge from the facility. (G8907) Patient did not have preoperative order for IV antibiotic SSI prophylaxis. (G8918)  

## 2011-11-10 NOTE — Patient Instructions (Signed)
CALL FOR OFFICE APPOINTMENT WITH DR. PYRTLE IN [place], [phone].   YOU HAD AN ENDOSCOPIC PROCEDURE TODAY AT THE [facility]: Refer to the procedure report that was given to you for any specific questions about what was found during the examination.  If the procedure report does not answer your questions, please call your gastroenterologist to clarify.  If you requested that your care partner not be given the details of your procedure findings, then the procedure report has been included in a sealed envelope for you to review at your convenience later.  YOU SHOULD EXPECT: Some feelings of bloating in the abdomen. Passage of more gas than usual.  Walking can help get rid of the air that was put into your GI tract during the procedure and reduce the bloating. If you had a lower endoscopy (such as a colonoscopy or flexible sigmoidoscopy) you may notice spotting of blood in your stool or on the toilet paper. If you underwent a bowel prep for your procedure, then you may not have a normal bowel movement for a few days.  DIET: Your first meal following the procedure should be a light meal and then it is ok to progress to your normal diet.  A half-sandwich or bowl of soup is an example of a good first meal.  Heavy or fried foods are harder to digest and may make you feel nauseous or bloated.  Likewise meals heavy in dairy and vegetables can cause extra gas to form and this can also increase the bloating.  Drink plenty of fluids but you should avoid alcoholic beverages for 24 hours.  ACTIVITY: Your care partner should take you home directly after the procedure.  You should plan to take it easy, moving slowly for the rest of the day.  You can resume normal activity the day after the procedure however you should NOT DRIVE or use heavy machinery for 24 hours (because of the sedation medicines used during the test).    SYMPTOMS TO REPORT IMMEDIATELY: A gastroenterologist can be reached at any  hour.  During normal business hours, 8:30 AM to 5:00 PM Monday through Friday, call [phone].  After hours and on weekends, please call the GI answering service at [phone] who will take a message and have the physician on call contact you.   Following upper endoscopy (EGD)  Vomiting of blood or coffee ground material  New chest pain or pain under the shoulder blades  Painful or persistently difficult swallowing  New shortness of breath  Fever of 100F or higher  Black, tarry-looking stools  FOLLOW UP: If any biopsies were taken you will be contacted by phone or by letter within the next 1-3 weeks.  Call your gastroenterologist if you have not heard about the biopsies in 3 weeks.  Our staff will call the home number listed on your records the next business day following your procedure to check on you and address any questions or concerns that you may have at that time regarding the information given to you following your procedure. This is a courtesy call and so if there is no answer at the home number and we have not heard from you through the emergency physician on call, we will assume that you have returned to your regular daily activities without incident.  SIGNATURES/CONFIDENTIALITY: You and/or your care partner have signed paperwork which will be entered into your electronic medical record.  These signatures attest to the fact that that the information above on  your After Visit Summary has been reviewed and is understood.  Full responsibility of the confidentiality of this discharge information lies with you and/or your care-partner.

## 2011-11-10 NOTE — Op Note (Signed)
[facility] [address] [place], [address]   ENDOSCOPY PROCEDURE REPORT  PATIENT: Janice, Lyons  MR#: [id number] BIRTHDATE: [birth year] , 36  yrs. old GENDER: Female ENDOSCOPIST: Beverley Fiedler, MD REFERRED BY:  Ralene Ok PROCEDURE DATE:  11/10/2011 PROCEDURE:  EGD w/ biopsy ASA CLASS:     Class II INDICATIONS:  epigastric pain.   history of GERD.   abdominal bloating. MEDICATIONS: MAC sedation, administered by CRNA and propofol (Diprivan) 300mg  IV TOPICAL ANESTHETIC: Cetacaine Spray  DESCRIPTION OF PROCEDURE: After the risks benefits and alternatives of the procedure were thoroughly explained, informed consent was obtained.  The LB [id number] endoscope was introduced through the mouth and advanced to the second portion of the duodenum. Without limitations.  The instrument was slowly withdrawn as the mucosa was fully examined.  ESOPHAGUS: A sessile polyp measuring 4 mm in size was found 30 cm from the incisors.  Multiple biopsies was performed using cold forceps.   The esophagus was otherwise normal, Z-line at 38 cm and normal.  STOMACH: Mild striped gastritis (inflammation) was found in the gastric antrum.  Biopsies were taken in the antrum, body, and angularis.   The stomach otherwise appeared normal.  DUODENUM: The duodenal mucosa showed no abnormalities in the bulb and second portion of the duodenum.  Cold forceps biopsies were taken in the bulb and second portion. Retroflexed views revealed no abnormalities.     The scope was then withdrawn from the patient and the procedure completed.  COMPLICATIONS: There were no complications. ENDOSCOPIC IMPRESSION: 1.   Sessile polyp measuring 4 mm in size was found 30 cm from the incisors 2.   The esophagus was otherwise normal. 3.   Gastritis (inflammation) was found in the gastric antrum; biopsies were taken in the antrum and angularis 4.   The stomach otherwise appeared normal 5.   The duodenal  mucosa showed no abnormalities in the bulb and second portion of the duodenum  RECOMMENDATIONS: 1.  Await pathology results 2.  Continue current medications 3.  Clinic follow-up in about 1 month.  eSigned:  Beverley Fiedler, MD 11/10/2011 10:20 AM CC:Roy Ludwig Clarks MD and The Patient

## 2011-11-11 ENCOUNTER — Other Ambulatory Visit: Payer: Self-pay | Admitting: *Deleted

## 2011-11-11 ENCOUNTER — Telehealth: Payer: Self-pay | Admitting: *Deleted

## 2011-11-11 NOTE — Telephone Encounter (Signed)
Left message on f/u callback 

## 2011-11-16 ENCOUNTER — Encounter: Payer: Self-pay | Admitting: Internal Medicine

## 2011-11-18 ENCOUNTER — Telehealth: Payer: Self-pay | Admitting: Internal Medicine

## 2011-11-19 NOTE — Telephone Encounter (Signed)
lmom for pt to call back

## 2011-11-23 NOTE — Telephone Encounter (Signed)
Pt never called back.

## 2011-11-25 ENCOUNTER — Telehealth: Payer: Self-pay | Admitting: Internal Medicine

## 2011-11-25 MED ORDER — SACCHAROMYCES BOULARDII 250 MG PO CAPS: 250.0000 mg | ORAL_CAPSULE | Freq: Two times a day (BID) | ORAL | Status: DC

## 2011-11-25 NOTE — Telephone Encounter (Signed)
Informed pt of Dr Lauro Franklin recommendation of Florastor. She is willing to try it; she reports it took 30 days for improvement with Restora and only a week with Align. Informed her Dr Rhea Belton would like for her to try this 1st . Pt stated understanding and will keep [place] updated.

## 2011-11-25 NOTE — Telephone Encounter (Signed)
She did really well with the probiotic Restora, which we can no longer find. Align doesn't seem to have helped as much. I recommend trying florastor 250 mg BID in place of Align.  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 Have her keep [place] updated.

## 2011-11-25 NOTE — Telephone Encounter (Signed)
Pt seen on 11/02/11 followed by EGD on 11/10/11, for bloating and epigastric pain. She reports she was a little better after starting the Align and told Dr Rhea Belton this prior to EGD; path basically normal. Since the procedure, she has been bloated on and off, but for the last week,the bloating does not go away. She reports her abdomen has been bloated for 5 days. She has increased gas, belched, but cannot expel gas thru her rectum. She has a BM QOD which is good for her, but she doesn't feel emptied. Her Nexium was changed to Dexilant, but still takes 1-2 OTC antacids as well. Any advice or have pt see mid level? Thanks. She has a f/u mid November.

## 2011-11-30 ENCOUNTER — Encounter (HOSPITAL_COMMUNITY): Payer: Self-pay | Admitting: Pharmacist

## 2011-12-09 NOTE — H&P (Signed)
Janice Lyons is an 36 y.o. female G1P1 S/P LSO , S/P subsequent TAH and lysis of adhesions for endometriosis has RLQ pain. U/S 3.6 cm probable endometrioma and 4.9 cm simple cyst. CA 125 = 16.8 cm and CT showed cystic changes of rt ovary and otherwise WNL in July of 2013.  Pertinent Gynecological History: Menses: TAH Bleeding: N/A Contraception: none DES exposure: denies Blood transfusions: none Sexually transmitted diseases: no past history Previous GYN Procedures: See above  Last mammogram: not done Date: N/A Last pap: normal Date: 2012 OB History: G1, P1   Menstrual History: Menarche age: unknown No LMP recorded. Patient has had a hysterectomy.    Past Medical History  Diagnosis Date  . TMJ (dislocation of temporomandibular joint)   . Endometriosis   . Reflux   . Yeast infection   . Interstitial cystitis     Past Surgical History  Procedure Date  . Abdominal hysterectomy   . Bilateral temporomandibular joint arthroplasty   . Breast surgery   . Wisdom tooth extraction     Family History  Problem Relation Age of Onset  . Diabetes Maternal Grandfather   . Thyroid cancer Father   . Leukemia Maternal Grandfather   . Hypertension Mother   . Stroke Father     Social History:  reports that she quit smoking about 18 years ago. She does not have any smokeless tobacco history on file. She reports that she drinks alcohol. She reports that she does not use illicit drugs.  Allergies:  Allergies  Allergen Reactions  . Oxycodone Itching    Facial itching  . Uribel (Uretron D-S) Itching, Nausea Only and Palpitations    No prescriptions prior to admission    Review of Systems  Constitutional: Negative for fever.  Genitourinary:       RLQ pain    Height 5\' 5"  (1.651 m), weight 155 lb (70.308 kg). Physical Exam  Cardiovascular: Normal rate and regular rhythm.   Respiratory: Effort normal and breath sounds normal.  GI: She exhibits no distension.       Mild RLQ  tenderness without mass or rebound  Genitourinary:       Tender Rt adnexa    No results found for this or any previous visit (from the past 24 hour(s)).  No results found.  Assessment/Plan: 36 yo G1P1 S/P TAH/LSO now with RLQ pain and probable endometrioma of rt ovary.  D/W patient L/S, possible laparotomy, ovarian cystectomy. Will try to retain some of ovary. Patient understands risks of recurrent or persistent pain/cysts, and also risks of surgery-infection, bleeding/transfusion-HIV/Hep, organ damage, DVT/PE, pneumonia, pelvic pain, pain with intercourse.  Kariem Wolfson II,Arnell Mausolf E 12/09/2011, 5:36 PM

## 2011-12-10 ENCOUNTER — Encounter (HOSPITAL_COMMUNITY)
Admission: RE | Admit: 2011-12-10 | Discharge: 2011-12-10 | Disposition: A | Discharge status: Home / Self Care | Payer: PRIVATE HEALTH INSURANCE | Source: Ambulatory Visit | Attending: Obstetrics and Gynecology | Admitting: Obstetrics and Gynecology

## 2011-12-10 ENCOUNTER — Encounter (HOSPITAL_COMMUNITY): Payer: Self-pay

## 2011-12-10 HISTORY — DX: Other complications of anesthesia, initial encounter: T88.59XA

## 2011-12-10 HISTORY — DX: Gastro-esophageal reflux disease without esophagitis: K21.9

## 2011-12-10 HISTORY — DX: Adverse effect of unspecified anesthetic, initial encounter: T41.45XA

## 2011-12-10 LAB — SURGICAL PCR SCREEN
MRSA, PCR: NEGATIVE
Staphylococcus aureus: NEGATIVE

## 2011-12-10 LAB — CBC
HCT: 40.6 % (ref 36.0–46.0)
Hemoglobin: 13.7 g/dL (ref 12.0–15.0)
RDW: 12.7 % (ref 11.5–15.5)
WBC: 6.5 10*3/uL (ref 4.0–10.5)

## 2011-12-10 MED ORDER — ONDANSETRON HCL 4 MG/2ML IJ SOLN
INTRAMUSCULAR | Status: AC
  Filled 2011-12-10: qty 2

## 2011-12-10 MED ORDER — DEXAMETHASONE SODIUM PHOSPHATE 10 MG/ML IJ SOLN
INTRAMUSCULAR | Status: AC
  Filled 2011-12-10: qty 1

## 2011-12-10 MED ORDER — MIDAZOLAM HCL 2 MG/2ML IJ SOLN
INTRAMUSCULAR | Status: AC
  Filled 2011-12-10: qty 2

## 2011-12-10 MED ORDER — PROPOFOL 10 MG/ML IV EMUL
INTRAVENOUS | Status: AC
  Filled 2011-12-10: qty 20

## 2011-12-10 MED ORDER — LIDOCAINE HCL (CARDIAC) 20 MG/ML IV SOLN
INTRAVENOUS | Status: AC
  Filled 2011-12-10: qty 5

## 2011-12-10 MED ORDER — KETOROLAC TROMETHAMINE 30 MG/ML IJ SOLN
INTRAMUSCULAR | Status: AC
  Filled 2011-12-10: qty 2

## 2011-12-10 MED ORDER — FENTANYL CITRATE 0.05 MG/ML IJ SOLN
INTRAMUSCULAR | Status: AC
  Filled 2011-12-10: qty 5

## 2011-12-10 NOTE — Patient Instructions (Addendum)
20 Janice Lyons  12/10/2011   Your procedure is scheduled on:  12/13/11  Enter through the Main Entrance of [facility] at 9 AM.  Pick up the phone at the desk and dial [phone].   Call this number if you have problems the morning of surgery: [phone]   Remember:   Do not eat food:After Midnight.  Do not drink clear liquids: After Midnight.  Take these medicines the morning of surgery with A SIP OF WATER: NA   Do not wear jewelry, make-up or nail polish.  Do not wear lotions, powders, or perfumes. You may wear deodorant.  Do not shave 48 hours prior to surgery.  Do not bring valuables to the hospital.  Contacts, dentures or bridgework may not be worn into surgery.  Leave suitcase in the car. After surgery it may be brought to your room.  For patients admitted to the hospital, checkout time is 11:00 AM the day of discharge.   Patients discharged the day of surgery will not be allowed to drive home.  Name and phone number of your driver: NA  Special Instructions: Shower using CHG 2 nights before surgery and the night before surgery.  If you shower the day of surgery use CHG.  Use special wash - you have one bottle of CHG for all showers.  You should use approximately 1/3 of the bottle for each shower.   Please read over the following fact sheets that you were given: MRSA Information

## 2011-12-13 ENCOUNTER — Ambulatory Visit (HOSPITAL_COMMUNITY)
Admission: RE | Admit: 2011-12-13 | Discharge: 2011-12-13 | Disposition: A | Discharge status: Home / Self Care | Payer: PRIVATE HEALTH INSURANCE | Source: Ambulatory Visit | Attending: Obstetrics and Gynecology | Admitting: Obstetrics and Gynecology

## 2011-12-13 ENCOUNTER — Encounter (HOSPITAL_COMMUNITY): Payer: Self-pay | Admitting: Anesthesiology

## 2011-12-13 ENCOUNTER — Ambulatory Visit (HOSPITAL_COMMUNITY): Payer: PRIVATE HEALTH INSURANCE | Admitting: Anesthesiology

## 2011-12-13 ENCOUNTER — Encounter (HOSPITAL_COMMUNITY)
Admission: RE | Disposition: A | Discharge status: Home / Self Care | Payer: Self-pay | Source: Ambulatory Visit | Attending: Obstetrics and Gynecology

## 2011-12-13 ENCOUNTER — Encounter (HOSPITAL_COMMUNITY): Payer: Self-pay | Admitting: *Deleted

## 2011-12-13 DIAGNOSIS — Z01818 Encounter for other preprocedural examination: Secondary | ICD-10-CM | POA: Insufficient documentation

## 2011-12-13 DIAGNOSIS — N83209 Unspecified ovarian cyst, unspecified side: Secondary | ICD-10-CM | POA: Insufficient documentation

## 2011-12-13 DIAGNOSIS — N736 Female pelvic peritoneal adhesions (postinfective): Secondary | ICD-10-CM | POA: Insufficient documentation

## 2011-12-13 DIAGNOSIS — R1031 Right lower quadrant pain: Secondary | ICD-10-CM | POA: Insufficient documentation

## 2011-12-13 DIAGNOSIS — Z01812 Encounter for preprocedural laboratory examination: Secondary | ICD-10-CM | POA: Insufficient documentation

## 2011-12-13 HISTORY — PX: LAPAROSCOPIC LYSIS OF ADHESIONS: SHX5905

## 2011-12-13 HISTORY — PX: OVARIAN CYST REMOVAL: SHX89

## 2011-12-13 HISTORY — PX: LAPAROSCOPY: SHX197

## 2011-12-13 SURGERY — LAPAROSCOPY OPERATIVE
Anesthesia: General | Site: Abdomen | Laterality: Right | Wound class: Clean Contaminated

## 2011-12-13 MED ORDER — ROCURONIUM BROMIDE 50 MG/5ML IV SOLN
INTRAVENOUS | Status: AC
  Filled 2011-12-13: qty 1

## 2011-12-13 MED ORDER — LIDOCAINE HCL (CARDIAC) 20 MG/ML IV SOLN
INTRAVENOUS | Status: AC
  Filled 2011-12-13: qty 5

## 2011-12-13 MED ORDER — PROPOFOL 10 MG/ML IV EMUL
INTRAVENOUS | Status: AC
  Filled 2011-12-13: qty 20

## 2011-12-13 MED ORDER — KETOROLAC TROMETHAMINE 30 MG/ML IJ SOLN
INTRAMUSCULAR | Status: AC
  Filled 2011-12-13: qty 1

## 2011-12-13 MED ORDER — HYDROMORPHONE HCL PF 1 MG/ML IJ SOLN
INTRAMUSCULAR | Status: DC | PRN
  Administered 2011-12-13: 1 mg via INTRAVENOUS

## 2011-12-13 MED ORDER — NEOSTIGMINE METHYLSULFATE 1 MG/ML IJ SOLN
INTRAMUSCULAR | Status: AC
  Filled 2011-12-13: qty 10

## 2011-12-13 MED ORDER — HYDROMORPHONE HCL PF 1 MG/ML IJ SOLN
INTRAMUSCULAR | Status: AC
  Filled 2011-12-13: qty 1

## 2011-12-13 MED ORDER — METOCLOPRAMIDE HCL 5 MG/ML IJ SOLN
10.0000 mg | Freq: Once | INTRAMUSCULAR | Status: AC | PRN
  Administered 2011-12-13: 10 mg via INTRAVENOUS

## 2011-12-13 MED ORDER — MEPERIDINE HCL 25 MG/ML IJ SOLN: 6.2500 mg | INTRAMUSCULAR | Status: DC | PRN

## 2011-12-13 MED ORDER — ONDANSETRON HCL 4 MG/2ML IJ SOLN
INTRAMUSCULAR | Status: AC
  Filled 2011-12-13: qty 2

## 2011-12-13 MED ORDER — ROCURONIUM BROMIDE 100 MG/10ML IV SOLN
INTRAVENOUS | Status: DC | PRN
  Administered 2011-12-13: 40 mg via INTRAVENOUS

## 2011-12-13 MED ORDER — CEFAZOLIN SODIUM-DEXTROSE 2-3 GM-% IV SOLR
2.0000 g | INTRAVENOUS | Status: AC
  Administered 2011-12-13: 2 g via INTRAVENOUS

## 2011-12-13 MED ORDER — DEXAMETHASONE SODIUM PHOSPHATE 10 MG/ML IJ SOLN
INTRAMUSCULAR | Status: AC
  Filled 2011-12-13: qty 1

## 2011-12-13 MED ORDER — NEOSTIGMINE METHYLSULFATE 1 MG/ML IJ SOLN
INTRAMUSCULAR | Status: DC | PRN
  Administered 2011-12-13: 3 mg via INTRAVENOUS

## 2011-12-13 MED ORDER — GLYCOPYRROLATE 0.2 MG/ML IJ SOLN
INTRAMUSCULAR | Status: DC | PRN
  Administered 2011-12-13: 0.4 mg via INTRAVENOUS

## 2011-12-13 MED ORDER — LIDOCAINE HCL (CARDIAC) 20 MG/ML IV SOLN
INTRAVENOUS | Status: DC | PRN
  Administered 2011-12-13: 80 mg via INTRAVENOUS

## 2011-12-13 MED ORDER — BUPIVACAINE HCL (PF) 0.25 % IJ SOLN
INTRAMUSCULAR | Status: AC
  Filled 2011-12-13: qty 90

## 2011-12-13 MED ORDER — METOCLOPRAMIDE HCL 5 MG/ML IJ SOLN
INTRAMUSCULAR | Status: AC
  Administered 2011-12-13: 10 mg via INTRAVENOUS
  Filled 2011-12-13: qty 2

## 2011-12-13 MED ORDER — MIDAZOLAM HCL 5 MG/5ML IJ SOLN
INTRAMUSCULAR | Status: DC | PRN
  Administered 2011-12-13: 2 mg via INTRAVENOUS

## 2011-12-13 MED ORDER — FENTANYL CITRATE 0.05 MG/ML IJ SOLN
INTRAMUSCULAR | Status: DC | PRN
  Administered 2011-12-13 (×2): 100 ug via INTRAVENOUS

## 2011-12-13 MED ORDER — ONDANSETRON HCL 4 MG/2ML IJ SOLN
INTRAMUSCULAR | Status: DC | PRN
  Administered 2011-12-13: 4 mg via INTRAVENOUS

## 2011-12-13 MED ORDER — FENTANYL CITRATE 0.05 MG/ML IJ SOLN
INTRAMUSCULAR | Status: AC
  Filled 2011-12-13: qty 5

## 2011-12-13 MED ORDER — BUPIVACAINE HCL (PF) 0.5 % IJ SOLN
INTRAMUSCULAR | Status: AC
  Filled 2011-12-13: qty 30

## 2011-12-13 MED ORDER — PROPOFOL 10 MG/ML IV EMUL
INTRAVENOUS | Status: DC | PRN
  Administered 2011-12-13: 200 mg via INTRAVENOUS

## 2011-12-13 MED ORDER — GLYCOPYRROLATE 0.2 MG/ML IJ SOLN
INTRAMUSCULAR | Status: AC
  Filled 2011-12-13: qty 2

## 2011-12-13 MED ORDER — LACTATED RINGERS IV SOLN
INTRAVENOUS | Status: DC
  Administered 2011-12-13 (×2): via INTRAVENOUS

## 2011-12-13 MED ORDER — MIDAZOLAM HCL 2 MG/2ML IJ SOLN
INTRAMUSCULAR | Status: AC
  Filled 2011-12-13: qty 2

## 2011-12-13 MED ORDER — CEFAZOLIN SODIUM-DEXTROSE 2-3 GM-% IV SOLR
INTRAVENOUS | Status: AC
  Filled 2011-12-13: qty 50

## 2011-12-13 MED ORDER — BUPIVACAINE HCL (PF) 0.5 % IJ SOLN
INTRAMUSCULAR | Status: DC | PRN
  Administered 2011-12-13: 30 mL

## 2011-12-13 MED ORDER — LACTATED RINGERS IR SOLN
Status: DC | PRN
  Administered 2011-12-13: 1

## 2011-12-13 MED ORDER — FENTANYL CITRATE 0.05 MG/ML IJ SOLN: 25.0000 ug | INTRAMUSCULAR | Status: DC | PRN

## 2011-12-13 SURGICAL SUPPLY — 26 items
CANISTER SUCTION 2500CC (MISCELLANEOUS) ×4 IMPLANT
CATH ROBINSON RED A/P 16FR (CATHETERS) ×4 IMPLANT
CLOTH BEACON ORANGE TIMEOUT ST (SAFETY) ×4 IMPLANT
DERMABOND ADVANCED (GAUZE/BANDAGES/DRESSINGS) ×1
DERMABOND ADVANCED .7 DNX12 (GAUZE/BANDAGES/DRESSINGS) ×3 IMPLANT
DRESSING TELFA 8X3 (GAUZE/BANDAGES/DRESSINGS) ×4 IMPLANT
GLOVE BIO SURGEON STRL SZ8 (GLOVE) ×8 IMPLANT
GOWN PREVENTION PLUS LG XLONG (DISPOSABLE) ×8 IMPLANT
NEEDLE INSUFFLATION 14GA 120MM (NEEDLE) ×4 IMPLANT
NS IRRIG 1000ML POUR BTL (IV SOLUTION) ×4 IMPLANT
PACK ABDOMINAL GYN (CUSTOM PROCEDURE TRAY) ×4 IMPLANT
PACK LAPAROSCOPY BASIN (CUSTOM PROCEDURE TRAY) ×4 IMPLANT
PAD OB MATERNITY 4.3X12.25 (PERSONAL CARE ITEMS) ×4 IMPLANT
PROTECTOR NERVE ULNAR (MISCELLANEOUS) ×4 IMPLANT
SCISSORS LAP 5X45 EPIX DISP (ENDOMECHANICALS) ×4 IMPLANT
SET IRRIG TUBING LAPAROSCOPIC (IRRIGATION / IRRIGATOR) ×4 IMPLANT
SUT VIC AB 3-0 PS2 18 (SUTURE) ×1
SUT VIC AB 3-0 PS2 18XBRD (SUTURE) ×3 IMPLANT
SUT VICRYL 0 UR6 27IN ABS (SUTURE) ×4 IMPLANT
SYR 30ML LL (SYRINGE) ×4 IMPLANT
TOWEL OR 17X24 6PK STRL BLUE (TOWEL DISPOSABLE) ×8 IMPLANT
TRAY FOLEY CATH 14FR (SET/KITS/TRAYS/PACK) ×4 IMPLANT
TROCAR XCEL NON-BLD 11X100MML (ENDOMECHANICALS) ×4 IMPLANT
TROCAR XCEL NON-BLD 5MMX100MML (ENDOMECHANICALS) ×4 IMPLANT
WARMER LAPAROSCOPE (MISCELLANEOUS) ×4 IMPLANT
WATER STERILE IRR 1000ML POUR (IV SOLUTION) ×4 IMPLANT

## 2011-12-13 NOTE — Transfer of Care (Signed)
Immediate Anesthesia Transfer of Care Note  Patient: Janice Lyons  Procedure(s) Performed: Procedure(s) (LRB) with comments: LAPAROSCOPY OPERATIVE (N/A) OVARIAN CYSTECTOMY (Right) LAPAROSCOPIC LYSIS OF ADHESIONS (N/A)  Patient Location: PACU  Anesthesia Type:General  Level of Consciousness: awake, alert  and oriented  Airway & Oxygen Therapy: Patient Spontanous Breathing and Patient connected to nasal cannula oxygen  Post-op Assessment: Report given to PACU RN and Post -op Vital signs reviewed and stable  Post vital signs: stable  Complications: No apparent anesthesia complications

## 2011-12-13 NOTE — Anesthesia Preprocedure Evaluation (Signed)
Anesthesia Evaluation  Patient identified by MRN, date of birth, ID band Patient awake    Reviewed: Allergy & Precautions, H&P , NPO status , Patient's Chart, lab work & pertinent test results  Airway Mallampati: II TM Distance: >3 FB Neck ROM: Full    Dental No notable dental hx. (+) Teeth Intact and Caps,    Pulmonary neg pulmonary ROS, former smoker,    Pulmonary exam normal       Cardiovascular negative cardio ROS  Rhythm:Regular Rate:Normal     Neuro/Psych negative neurological ROS  negative psych ROS   GI/Hepatic Neg liver ROS, GERD-  Medicated and Controlled,  Endo/Other  negative endocrine ROS  Renal/GU negative Renal ROS   Interstitial Cystitis negative genitourinary   Musculoskeletal negative musculoskeletal ROS (+)   Abdominal Normal abdominal exam  (+)   Peds  Hematology negative hematology ROS (+)   Anesthesia Other Findings   Reproductive/Obstetrics Hx/o Endometriosis                           Anesthesia Physical Anesthesia Plan  ASA: II  Anesthesia Plan: General   Post-op Pain Management:    Induction: Intravenous  Airway Management Planned: Oral ETT  Additional Equipment:   Intra-op Plan:   Post-operative Plan: Extubation in OR  Informed Consent: I have reviewed the patients History and Physical, chart, labs and discussed the procedure including the risks, benefits and alternatives for the proposed anesthesia with the patient or authorized representative who has indicated his/her understanding and acceptance.   Dental advisory given  Plan Discussed with: CRNA, Anesthesiologist and Surgeon  Anesthesia Plan Comments:         Anesthesia Quick Evaluation

## 2011-12-13 NOTE — Brief Op Note (Signed)
12/13/2011  11:41 AM  PATIENT:  Janice Lyons  36 y.o. female  PRE-OPERATIVE DIAGNOSIS:  Right Ovarian Cyst  POST-OPERATIVE DIAGNOSIS:  Right Ovarian Cyst, pelvic adhesions  PROCEDURE:  Laparoscopy with extensive LOA and aspiration of right ovarian cyst  SURGEON:  Surgeon(s) and Role:    * Leslie Andrea, MD - Primary  PHYSICIAN ASSISTANT:   ASSISTANTS: none   ANESTHESIA:   general  EBL:  Total I/O In: 1000 [I.V.:1000] Out: 130 [Urine:100; Blood:30]  BLOOD ADMINISTERED:none  DRAINS: none   LOCAL MEDICATIONS USED:  MARCAINE     SPECIMEN:  Source of Specimen:  aspirate of right ovarian cyst  DISPOSITION OF SPECIMEN:  PATHOLOGY  COUNTS:  YES  TOURNIQUET:  * No tourniquets in log *  DICTATION: .Other Dictation: Dictation Number  [id number]  PLAN OF CARE: Discharge to home after PACU  PATIENT DISPOSITION:  PACU - hemodynamically stable.   Delay start of Pharmacological VTE agent (>24hrs) due to surgical blood loss or risk of bleeding: yes

## 2011-12-13 NOTE — Progress Notes (Signed)
No changes to H&P per patient history. Reviewed with patient procedure. 

## 2011-12-13 NOTE — Preoperative (Signed)
Beta Blockers   Reason not to administer Beta Blockers:Not Applicable 

## 2011-12-13 NOTE — Anesthesia Postprocedure Evaluation (Signed)
  Anesthesia Post-op Note  Patient: Scientist, clinical (histocompatibility and immunogenetics)  Procedure(s) Performed: Procedure(s) (LRB) with comments: LAPAROSCOPY OPERATIVE (N/A) OVARIAN CYSTECTOMY (Right) LAPAROSCOPIC LYSIS OF ADHESIONS (N/A)  Patient Location: PACU  Anesthesia Type:General  Level of Consciousness: awake, alert  and oriented  Airway and Oxygen Therapy: Patient Spontanous Breathing  Post-op Pain: none  Post-op Assessment: Post-op Vital signs reviewed, Patient's Cardiovascular Status Stable, Respiratory Function Stable, Patent Airway, No signs of Nausea or vomiting and Pain level controlled  Post-op Vital Signs: Reviewed and stable  Complications: No apparent anesthesia complications

## 2011-12-14 ENCOUNTER — Encounter (HOSPITAL_COMMUNITY): Payer: Self-pay | Admitting: Obstetrics and Gynecology

## 2011-12-14 ENCOUNTER — Ambulatory Visit: Payer: PRIVATE HEALTH INSURANCE | Admitting: Internal Medicine

## 2011-12-14 NOTE — Op Note (Signed)
Janice Lyons, Janice Lyons              ACCOUNT NO.:  [id number]  MEDICAL RECORD NO.:  [id number]  LOCATION:  [facility]                          FACILITY:  [facility]  PHYSICIAN:  Guy Sandifer. Henderson Cloud, M.D. DATE OF BIRTH:  1975/04/10  DATE OF PROCEDURE:  12/13/2011 DATE OF DISCHARGE:  12/13/2011                              OPERATIVE REPORT   PREOPERATIVE DIAGNOSIS:  Right ovarian cyst.  POSTOPERATIVE DIAGNOSES: 1. Right ovarian cyst. 2. Pelvic adhesions.  PROCEDURE:  Laparoscopy with extensive lysis of adhesions and aspiration of right ovarian cyst.  SURGEON:  Guy Sandifer. Henderson Cloud, M.D.  ANESTHESIA:  General with endotracheal intubation, Mal Amabile, MD.  ESTIMATED BLOOD LOSS:  Drops.  SPECIMENS:  Aspiration of right ovarian cyst to Cytology.  INDICATIONS AND CONSENT:  This patient is a 36 year old married white female, status post LSO, status post subsequent hysterectomy, who has right lower quadrant pain, becoming more severe.  On ultrasound, she has a approximately 3-cm hemorrhagic type cyst and approximately 5-cm serous type cyst.  She has known adhesions in the pelvis.  After discussion of the options, she is being taken to the operating room for laparoscopy, probable right ovarian cystectomy, possible laparotomy.  She desires to keep at least a portion of the ovary if at all possible.  She understands the cyst and/or pain can persist or recur.  Potential risks and complications of the procedure were discussed preoperatively including, but not limited to, infection, organ damage, bleeding requiring transfusion of blood products with HIV and hepatitis acquisition, DVT, PE, pneumonia, persistent or recurrent pain, pain with intercourse, and recurrent cyst.  All questions had been answered and consent was signed on the chart.  FINDINGS:  Upper abdomen is grossly normal.  The cul-de-sac is clear. The ovary is adherent at the right pelvic brim.  It is approximately 6-7 cm in diameter.  It is  covered with filmy adhesions rising from the small bowel as well as the appendix is adherent to the base of the cyst.  PROCEDURE:  The patient was taken to the operating room where she was identified, placed in dorsal supine position, and general anesthesia Was induced via endotracheal intubation.  She was placed in a dorsal lithotomy position.  Time-out was undertaken.  She was prepped abdominally and vaginally.  Bladder was straight catheterized and she was draped in a sterile fashion.  The infraumbilical and suprapubic areas were injected in midline with approximately 5 mL of plain Marcaine.  A small infraumbilical incision was made and disposable Veress needle was placed on the first attempt without difficulty.  A good syringe and drop test were noted.  Two liters of gas were then insufflated under low pressure with good tympany in the right upper quadrant.  Veress needle was removed and a 10/11 XL bladeless disposable trocar sleeve was placed using the diagnostic laparoscope with direct visualization.  After placement, the operative laparoscope was used.  A small suprapubic incision was made and a 5-mm XL bladeless disposable trocar sleeve was placed under direct visualization without difficulty. The above findings were noted.  Then using the disposable Metzenbaum- type scissors, the adhesions from the bowel to the cysts were taken down.  These were filmy, held  on stretch and dissection was done well clear of the serosa of the bowel all the way around.  This uncovered the cyst on the ovary.  It was then aspirated for approximately 45 mL of straw-colored serous fluid, which was sent for cytology.  The cyst cavity was then entered with Metzenbaum scissors and seemed to be smooth.  The remainder of the ovary was largely retroperitoneal.  It was felt that at this point to continue would place the patient at increased risk of losing her ovary, which she wants to keep.  Hemostasis  was obtained on the ovary with bipolar cautery.  Gelfoam was back-loaded through the laparoscope and placed over the incision on the ovary as well.  The remaining approximately 25 mL of 0.5% plain Marcaine was instilled into the peritoneal cavity.  Suprapubic trocar sleeve was removed.  Pneumoperitoneum was reduced.  The umbilical trocar sleeve was removed.  Skin was closed with interrupted 3-0 Vicryl suture.  Dermabond was placed on both incisions.  All instrument counts were correct.  The patient was awakened and taken to the recovery room in stable condition.     Guy Sandifer Henderson Cloud, M.D.     JET/MEDQ  D:  12/13/2011  T:  12/14/2011  Job:  [id number]

## 2011-12-17 ENCOUNTER — Telehealth: Payer: Self-pay | Admitting: Internal Medicine

## 2011-12-17 MED ORDER — RIFAXIMIN 550 MG PO TABS: 550.0000 mg | ORAL_TABLET | Freq: Two times a day (BID) | ORAL | Status: DC

## 2011-12-17 NOTE — Telephone Encounter (Signed)
Trial of rifaximin 550 mg 3 times a day x10 days for empiric treatment of SIBO

## 2011-12-17 NOTE — Telephone Encounter (Signed)
Per 11/25/11 note pt was to try Florastor for 3 weeks and if no better, Dr Rhea Belton may try Xifaxan. Pt reports she still has the problem with bloating ; she has eaten 2 eggs only today and is miserable d/t the bloating. Please advise. Thanks.

## 2011-12-17 NOTE — Telephone Encounter (Signed)
Informed pt we will order the Florastor and keep [place] informed of her condition; pt stated understanding.

## 2011-12-27 ENCOUNTER — Encounter: Payer: Self-pay | Admitting: Internal Medicine

## 2012-01-03 ENCOUNTER — Ambulatory Visit (INDEPENDENT_AMBULATORY_CARE_PROVIDER_SITE_OTHER): Payer: PRIVATE HEALTH INSURANCE | Admitting: Internal Medicine

## 2012-01-03 ENCOUNTER — Encounter: Payer: Self-pay | Admitting: Internal Medicine

## 2012-01-03 VITALS — BP 100/70 | HR 68 | Ht 65.0 in | Wt 157.4 lb

## 2012-01-03 DIAGNOSIS — K6389 Other specified diseases of intestine: Secondary | ICD-10-CM

## 2012-01-03 DIAGNOSIS — R1013 Epigastric pain: Secondary | ICD-10-CM

## 2012-01-03 DIAGNOSIS — R141 Gas pain: Secondary | ICD-10-CM

## 2012-01-03 DIAGNOSIS — R140 Abdominal distension (gaseous): Secondary | ICD-10-CM

## 2012-01-03 DIAGNOSIS — R197 Diarrhea, unspecified: Secondary | ICD-10-CM

## 2012-01-03 DIAGNOSIS — K219 Gastro-esophageal reflux disease without esophagitis: Secondary | ICD-10-CM

## 2012-01-03 MED ORDER — RIFAXIMIN 550 MG PO TABS: 550.0000 mg | ORAL_TABLET | Freq: Two times a day (BID) | ORAL | Status: DC

## 2012-01-03 MED ORDER — DEXLANSOPRAZOLE 60 MG PO CPDR: 60.0000 mg | DELAYED_RELEASE_CAPSULE | Freq: Every day | ORAL | Status: DC

## 2012-01-03 MED ORDER — SACCHAROMYCES BOULARDII 250 MG PO CAPS: 250.0000 mg | ORAL_CAPSULE | Freq: Two times a day (BID) | ORAL | Status: DC

## 2012-01-03 NOTE — Patient Instructions (Addendum)
Continue taking Dexilant daily. We have sent the following medications to your pharmacy for you to pick up at your convenience: Xifaxan twice a day for 1 month Restart Florastor about 1 week before coming off the [place], and continue taking daily after that.  Follow up with Dr. Rhea Belton in about 4 months.

## 2012-01-03 NOTE — Progress Notes (Signed)
Subjective:    Patient ID: Janice Lyons, female    DOB: 11/26/1975, 36 y.o.   MRN: [id number]  HPI Janice Lyons is a 36 year old female with a past medical history of endometriosis status post hysterectomy, interstitial cystitis, GERD who seen in followup. She was initially seen in October 2013 for evaluation of abdominal pain, bloating and GERD.  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 pylori, and normal-appearing small bowel biopsies were also normal with no evidence of celiac.  Her PPI was switched from Nexium to Dexilant 60 mg daily and she remains on this dose. The Dexilant has improved her heartburn and substernal burning, but not completely relieved it. She is still using when necessary famotidine for breakthrough symptoms. Her heartburn is worse after eating, but she has not had dysphagia or odynophagia.  She tried Librarian, academic without much benefit and switched to Florastor 250 BID.  This helped some but only slightly. 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 She reports this medication worked extremely well for her and completely relieved all of her GI symptoms. She reports she did not feel that well and months. She had no further abdominal pain, bloating, or chest discomfort while on this medication. She completed this therapy 3 days ago and within 48 hours her abdominal fullness and bloating have started to return. Her bowel movements have remained normal without blood or melena. No diarrhea or constipation. No fevers. She did have an ovarian cyst removal about 2-1/2 weeks ago which went uneventfully.   Review of Systems As per history of present illness, otherwise negative  Current Medications, Allergies, Past Medical History, Past Surgical History, Family History and Social History were reviewed in [employer] record.     Objective:   Physical Exam BP 100/70  Pulse 68  Ht 5\' 5"  (1.651 m)  Wt 157 lb 6.4 oz (71.396 kg)  BMI 26.19 kg/m2 Constitutional: Well-developed and well-nourished. No distress. HEENT: Normocephalic and atraumatic. Oropharynx is clear and moist. No oropharyngeal exudate. Conjunctivae are normal.  No scleral icterus. Cardiovascular: Normal rate, regular rhythm and intact distal pulses. No M/R/G Pulmonary/chest: Effort normal and breath sounds normal. No wheezing, rales or rhonchi. Abdominal: Soft, very mild tenderness without rebound or guarding, healing laparoscopic scars, nondistended. Bowel sounds active throughout. There are no masses palpable. No hepatosplenomegaly. Extremities: no clubbing, cyanosis, or edema Neurological: Alert and oriented to person place and time. Skin: Skin is warm and dry. No rashes noted. Psychiatric: Normal mood and affect. Behavior is normal.      Assessment & Plan:  36 year old female with a past medical history of endometriosis status post hysterectomy, interstitial cystitis, GERD who seen in followup  1.  Abd bloating/?SIBO -- the patient's abdominal bloating was completely relieved with rifaximin therapy. This supports the diagnosis of intestinal overgrowth, but this medication is also been proven to be beneficial in studies for IBS. Either way she had an excellent response. Unfortunately, the response does not seem to have been durable. For now I will repeat therapy with rifaximin 550 mg twice daily for an additional 4 weeks. I would like for her to resume probiotic, Florastor 250 mg BID 7 days before completing rifaximin he continue this thereafter. Hopefully this will provide more durable response to the nonabsorbable antibiotic.    2.  GERD -- improved with Dexilant, but  improved further with rifaximin which is difficult to explain. I do wonder she has component of gastroparesis, and we may consider gastric emptying  study. For now she'll continue Dexilant 60 mg daily 30 minutes before breakfast..

## 2012-01-25 ENCOUNTER — Telehealth: Payer: Self-pay | Admitting: Internal Medicine

## 2012-01-25 DIAGNOSIS — R1011 Right upper quadrant pain: Secondary | ICD-10-CM

## 2012-01-25 NOTE — Telephone Encounter (Signed)
Pt reports 1 week ago while sitting at work, she developed a sharp pain in her R side near her ribs. The pain lasted all night and she considered going to the ER. The next morning and now, she has a tender area with pain that goes all the way through to her back.  Please advise; offered her an appt, but she wanted your advice. Thanks.

## 2012-01-25 NOTE — Telephone Encounter (Signed)
lmom for pt to call back

## 2012-01-26 NOTE — Telephone Encounter (Signed)
Not sure etiology of pain.  If it worsens or fails to resolve she should be seen RUQ pain could be gallbladder in nature.  If persistent or recurring, I recommend abd [place], r/o gallstones.  Also CMP, lipase, CBC (she previously had EGD)

## 2012-01-27 NOTE — Telephone Encounter (Signed)
Informed pt of Dr Lauro Franklin recommendations and she would like to have the U/S done; she will go tomorrow and then have her labs.

## 2012-01-28 ENCOUNTER — Other Ambulatory Visit (INDEPENDENT_AMBULATORY_CARE_PROVIDER_SITE_OTHER): Payer: PRIVATE HEALTH INSURANCE

## 2012-01-28 ENCOUNTER — Ambulatory Visit (HOSPITAL_COMMUNITY)
Admission: RE | Admit: 2012-01-28 | Discharge: 2012-01-28 | Disposition: A | Discharge status: Home / Self Care | Payer: PRIVATE HEALTH INSURANCE | Source: Ambulatory Visit | Attending: Internal Medicine | Admitting: Internal Medicine

## 2012-01-28 DIAGNOSIS — R1011 Right upper quadrant pain: Secondary | ICD-10-CM

## 2012-01-28 LAB — CBC WITH DIFFERENTIAL/PLATELET
Basophils Relative: 0.8 % (ref 0.0–3.0)
Eosinophils Relative: 3 % (ref 0.0–5.0)
HCT: 40.2 % (ref 36.0–46.0)
Lymphs Abs: 2.1 10*3/uL (ref 0.7–4.0)
MCHC: 34.3 g/dL (ref 30.0–36.0)
MCV: 87.8 fl (ref 78.0–100.0)
Monocytes Absolute: 0.5 10*3/uL (ref 0.1–1.0)
RBC: 4.58 Mil/uL (ref 3.87–5.11)
WBC: 6.1 10*3/uL (ref 4.5–10.5)

## 2012-01-28 LAB — COMPREHENSIVE METABOLIC PANEL
ALT: 13 U/L (ref 0–35)
AST: 17 U/L (ref 0–37)
Albumin: 3.8 g/dL (ref 3.5–5.2)
Alkaline Phosphatase: 33 U/L — ABNORMAL LOW (ref 39–117)
BUN: 6 mg/dL (ref 6–23)
Creatinine, Ser: 0.9 mg/dL (ref 0.4–1.2)
Potassium: 3.9 mEq/L (ref 3.5–5.1)

## 2012-02-01 ENCOUNTER — Encounter: Payer: Self-pay | Admitting: Internal Medicine

## 2012-02-01 ENCOUNTER — Ambulatory Visit (INDEPENDENT_AMBULATORY_CARE_PROVIDER_SITE_OTHER): Payer: PRIVATE HEALTH INSURANCE | Admitting: Internal Medicine

## 2012-02-01 VITALS — BP 100/76 | HR 81 | Ht 64.5 in | Wt 159.2 lb

## 2012-02-01 DIAGNOSIS — R110 Nausea: Secondary | ICD-10-CM

## 2012-02-01 DIAGNOSIS — R1011 Right upper quadrant pain: Secondary | ICD-10-CM

## 2012-02-01 DIAGNOSIS — IMO0002 Reserved for concepts with insufficient information to code with codable children: Secondary | ICD-10-CM

## 2012-02-01 DIAGNOSIS — F515 Nightmare disorder: Secondary | ICD-10-CM

## 2012-02-01 DIAGNOSIS — R1013 Epigastric pain: Secondary | ICD-10-CM

## 2012-02-01 NOTE — Progress Notes (Signed)
Subjective:    Patient ID: Janice Lyons, female    DOB: 03-03-1975, 37 y.o.   MRN: [id number]  HPI Janice Lyons is a 37 year old female with a past medical history of endometriosis status post hysterectomy, interstitial cystitis, GERD who seen in followup. She was last seen in December at which time she had return of her abdominal bloating, which had previously improved with rifaximin therapy. At that time we decided to reinitiate rifaximin therapy for a longer course and juncture symptoms.  She call the office in late December complaining of right upper quadrant pain associated with nausea. She underwent basic labs and an ultrasound of her abdomen which was unremarkable. She returns today stating that her abdominal bloating and reflux her a lot better while she is taking rifaximin. Her diarrhea is also significantly improved and resolved. The last 2 weeks she has had right upper quadrant pain which radiates straight through to her back. For the most part has been somewhat constant pain with isolated attacks which last several hours. Her attacks can be quite severe pain. She also is having some nausea without vomiting, which seems to be triggered by smells. No fevers or chills. She also reports her last several weeks having vivid nightmares, having previously never had this problem.  Review of Systems As per history of present illness, otherwise negative  Current Medications, Allergies, Past Medical History, Past Surgical History, Family History and Social History were reviewed in [employer] record.     Objective:   Physical Exam BP 100/76  Pulse 81  Ht 5' 4.5" (1.638 m)  Wt 159 lb 3.2 oz (72.213 kg)  BMI 26.90 kg/m2 Constitutional: Well-developed and well-nourished. No distress. HEENT: Normocephalic and atraumatic. Oropharynx is clear and moist. No oropharyngeal exudate. Conjunctivae are normal.  No scleral icterus. Cardiovascular: Normal rate, regular rhythm and  intact distal pulses. No M/R/G Pulmonary/chest: Effort normal and breath sounds normal. No wheezing, rales or rhonchi. Abdominal: Soft, mild epigastric and moderate right upper quadrant tenderness without rebound or guarding, nondistended. Bowel sounds active throughout. There are no masses palpable. No hepatosplenomegaly. Extremities: no clubbing, cyanosis, or edema Neurological: Alert and oriented to person place and time. Skin: Skin is warm and dry. No rashes noted. Psychiatric: Normal mood and affect. Behavior is normal.  CMP     Component Value Date/Time   NA 141 01/28/2012 0851   K 3.9 01/28/2012 0851   CL 108 01/28/2012 0851   CO2 29 01/28/2012 0851   GLUCOSE 91 01/28/2012 0851   BUN 6 01/28/2012 0851   CREATININE 0.9 01/28/2012 0851   CALCIUM 9.1 01/28/2012 0851   PROT 7.3 01/28/2012 0851   ALBUMIN 3.8 01/28/2012 0851   AST 17 01/28/2012 0851   ALT 13 01/28/2012 0851   ALKPHOS 33* 01/28/2012 0851   BILITOT 0.7 01/28/2012 0851   GFRNONAA >90 08/20/2011 1530   GFRAA >90 08/20/2011 1530    Lipase     Component Value Date/Time   LIPASE 29.0 01/28/2012 0851    CBC    Component Value Date/Time   WBC 6.1 01/28/2012 0851   RBC 4.58 01/28/2012 0851   HGB 13.8 01/28/2012 0851   HCT 40.2 01/28/2012 0851   PLT 180.0 01/28/2012 0851   MCV 87.8 01/28/2012 0851   MCH 29.8 12/10/2011 0829   MCHC 34.3 01/28/2012 0851   RDW 13.4 01/28/2012 0851   LYMPHSABS 2.1 01/28/2012 0851   MONOABS 0.5 01/28/2012 0851   EOSABS 0.2 01/28/2012 0851   BASOSABS 0.0 01/28/2012  8469    Clinical Data:  Right upper quadrant pain   COMPLETE ABDOMINAL ULTRASOUND   Comparison:  CT abdomen and pelvis of 08/20/2011   Findings:   Gallbladder:  The gallbladder is visualized and no gallstones are noted.  There is no pain over the gallbladder with compression.   Common bile duct:  The common bile duct is normal measuring 4.4 mm in diameter.   Liver:  The liver has a normal echogenic pattern.  No focal abnormality is seen.  The tiny low  attenuation structures noted near the dome of the liver on CT are not visualized by ultrasound.   IVC:  Appears normal.   Pancreas:  No focal abnormality seen.   Spleen:  The spleen is normal measuring 4.8 cm sagittally.   Right Kidney:  No hydronephrosis is seen.  The right kidney measures 10.5 cm sagittally.   Left Kidney:  No hydronephrosis is noted.  The left kidney measures 9.6 cm.   Abdominal aorta:  The abdominal aorta is normal in caliber.   IMPRESSION:   1.  No gallstones.  No ductal dilatation. 2.  No hepatic abnormality is seen.       Assessment & Plan:  37 year old female with a past medical history of endometriosis status post hysterectomy, interstitial cystitis, GERD who seen in followup now with predominantly right upper quadrant pain  1.  RUQ pain -- the patient's right upper quadrant pain raises suspicion for biliary/gallbladder disease, however her ultrasound revealed no gallstones or other evidence for cholecystitis. She has previously had an upper endoscopy which was rather unrevealing, and has been maintained on PPI therapy making ulcer disease or gastritis/duodenitis unlikely. She's also had small bowel biopsies ruling out celiac disease. Her abdominal bloating and diarrhea have improved, likely as a result of rifaximin.  At this point we have discussed how to further evaluate her right upper quadrant pain as it certainly is impacting her daily.I've elected order a HIDA scan to evaluate her gallbladder function, and also refer her to [facility] for evaluation of this pain and possible cholecystectomy. We did discuss today how there is a subset of patients who have unexplained upper abdominal pain again no benefit or improvement from cholecystectomy. She understands this risk, and still wishes to discuss options with a surgeon, which I think is reasonable.  She was offered pain medication, but declined today. For now she will continue Dexilant 60 mg daily  and rifaximin 550 twice a day until completed.  2.  Nightmares -- not a typical side effect of rifaximin, but I cannot exclude this as an etiology. She feels it may be related to the pain and the stress of the pain.  For now she is okay with waiting until further evaluation is completed.

## 2012-02-01 NOTE — Patient Instructions (Addendum)
You have been scheduled for a Hida scan at [facility] on 02/09/2012 @ 12:30 please arrive at 12:15 report to radiology.  Have nothing to eat or drink 6 hours prior to scan.  If you cannot make this appointment please call [phone] to reschedule.  You will be contacted by [facility] for a possible surgical referral.

## 2012-02-07 ENCOUNTER — Ambulatory Visit (INDEPENDENT_AMBULATORY_CARE_PROVIDER_SITE_OTHER): Payer: PRIVATE HEALTH INSURANCE | Admitting: General Surgery

## 2012-02-07 ENCOUNTER — Encounter (INDEPENDENT_AMBULATORY_CARE_PROVIDER_SITE_OTHER): Payer: Self-pay | Admitting: General Surgery

## 2012-02-07 VITALS — BP 118/68 | HR 72 | Temp 97.8°F | Resp 16 | Ht 64.5 in | Wt 156.4 lb

## 2012-02-07 DIAGNOSIS — R1011 Right upper quadrant pain: Secondary | ICD-10-CM

## 2012-02-07 DIAGNOSIS — R140 Abdominal distension (gaseous): Secondary | ICD-10-CM | POA: Insufficient documentation

## 2012-02-07 NOTE — Progress Notes (Signed)
Patient ID: Janice Lyons, female   DOB: 07/15/1975, 37 y.o.   MRN: [id number]  Chief Complaint  Patient presents with  . New Evaluation    RUQ pain - gallbladder    HPI Janice Lyons is a 37 y.o. female.  Referred by Dr. Erick Blinks HPI This is a 37 year old female with a long history of endometriosis who had been doing well until 07/29/2011. She has baseline reflux which hadbeen controlled Nexium. She ate that day and states she drinks 3 beers. Since then she has had problems. She began with mid right-sided abdominal pain. She thought she had a urinary tract infection and she been tested several times over the last year this is always been clear. She went to urgent care and was found to have no urinary tract infection was referred to a urologist and I pointed that she was diagnosed with interstitial cystitis. She still continued to have right upper quadrant pain combined with diarrhea and abdominal bloating. She was not able to eat a lot without becoming bloated immediately. [place] CT scan which showed an ovarian cyst but was otherwise normal. She followed up with her gynecologist here and was noted a repeat followup study that her ovarian cyst and increased in size. She was also having right lower quadrant pain at that time. On November 18 she underwent a diagnostic laparoscopy as well as sounds like lysis of adhesions and aspiration of that and now herright lower quadrant pain is completely gone. Her other symptoms have continued. She was then referred to Dr. Rhea Belton for these symptoms. Her GERD was much worse and she was switched to Dexillant was feeling better but she still uses some additional antacids on top of that. Her EGD is fairly unremarkable. She was also begun on rifaxmin Taking this twice a day got rid of all of her symptoms. This was very expensive and now she is on this once a day and the pain as well as the bloating is manageable. On December 23 she  had an acute worsening of this where she noticed a right upper quadrant pain that radiated straight towards her back. This has occurred continuously since then. It is much worse at night as well as with fatty foods. Is worse when she bends over. She comes in today to evaluate her for possible gallbladder disease as a source of her symptoms. She has a normal ultrasound right now. Past Medical History  Diagnosis Date  . TMJ (dislocation of temporomandibular joint)   . Endometriosis   . Yeast infection   . Interstitial cystitis   . Complication of anesthesia     severe facial itching  . GERD (gastroesophageal reflux disease)   . Gastritis   . Chills   . Nasal congestion   . Chest pain   . Constipation   . Abdominal distension   . Abdominal pain   . Nausea   . Weakness   . Generalized headaches   . Bruises easily     Past Surgical History  Procedure Date  . Abdominal hysterectomy   . Bilateral temporomandibular joint arthroplasty   . Breast surgery   . Wisdom tooth extraction   . Diagnostic laparoscopy   . Laparotomy   . Vaginal delivery 1998  . Laparoscopy 12/13/2011    Procedure: LAPAROSCOPY OPERATIVE;  Surgeon: Leslie Andrea, MD;  Location: [facility];  Service: Gynecology;  Laterality: N/A;  . Ovarian cyst removal 12/13/2011  Procedure: OVARIAN CYSTECTOMY;  Surgeon: Leslie Andrea, MD;  Location: [facility];  Service: Gynecology;  Laterality: Right;  . Laparoscopic lysis of adhesions 12/13/2011    Procedure: LAPAROSCOPIC LYSIS OF ADHESIONS;  Surgeon: Leslie Andrea, MD;  Location: [facility];  Service: Gynecology;  Laterality: N/A;    Family History  Problem Relation Age of Onset  . Diabetes Maternal Grandfather   . Leukemia Maternal Grandfather   . Cancer Maternal Grandfather     leukemia  . Thyroid cancer Father   . Stroke Father   . Hypertension Mother   . Cancer Paternal Grandmother     does not what type    Social History History  Substance Use Topics    . Smoking status: Former Smoker    Quit date: 11/01/1993  . Smokeless tobacco: Never Used  . Alcohol Use: Yes     Comment: rarely    Allergies  Allergen Reactions  . Oxycodone Itching    Facial itching  . Uribel (Uretron D-S) Itching, Nausea Only and Palpitations    Current Outpatient Prescriptions  Medication Sig Dispense Refill  . dexlansoprazole (DEXILANT) 60 MG capsule Take 1 capsule (60 mg total) by mouth daily.  90 capsule  3  . pentosan polysulfate (ELMIRON) 100 MG capsule 100 mg. Bladder injection as needed for interstitial cystitis      . rifaximin (XIFAXAN) 550 MG TABS Take 1 tablet (550 mg total) by mouth 2 (two) times daily.  60 tablet  0    Review of Systems Review of Systems  Constitutional: Positive for chills. Negative for fever and unexpected weight change.  HENT: Positive for congestion. Negative for hearing loss, sore throat, trouble swallowing and voice change.   Eyes: Negative for visual disturbance.  Respiratory: Negative for cough and wheezing.   Cardiovascular: Positive for chest pain. Negative for palpitations and leg swelling.  Gastrointestinal: Positive for nausea, abdominal pain, constipation and abdominal distention. Negative for vomiting, diarrhea, blood in stool and anal bleeding.  Genitourinary: Negative for hematuria, vaginal bleeding and difficulty urinating.  Musculoskeletal: Negative for arthralgias.  Skin: Negative for rash and wound.  Neurological: Positive for weakness and headaches. Negative for seizures and syncope.  Hematological: Negative for adenopathy. Does not bruise/bleed easily.  Psychiatric/Behavioral: Negative for confusion.    Blood pressure 118/68, pulse 72, temperature 97.8 F (36.6 C), temperature source Temporal, resp. rate 16, height 5' 4.5" (1.638 m), weight 156 lb 6 oz (70.931 kg).  Physical Exam Physical Exam  Vitals reviewed. Constitutional: She appears well-developed and well-nourished.  Eyes: No scleral  icterus.  Cardiovascular: Normal rate, regular rhythm and normal heart sounds.   Pulmonary/Chest: Effort normal and breath sounds normal. She has no wheezes. She has no rales.  Abdominal: Soft. Bowel sounds are normal. She exhibits no distension. There is tenderness (mild to palpation) in the right upper quadrant. There is negative Murphy's sign.  Lymphadenopathy:    She has no cervical adenopathy.    Data Reviewed COMPLETE ABDOMINAL ULTRASOUND  Comparison: CT abdomen and pelvis of 08/20/2011  Findings:  Gallbladder: The gallbladder is visualized and no gallstones are  noted. There is no pain over the gallbladder with compression.  Common bile duct: The common bile duct is normal measuring 4.4 mm  in diameter.  Liver: The liver has a normal echogenic pattern. No focal  abnormality is seen. The tiny low attenuation structures noted  near the dome of the liver on CT are not visualized by ultrasound.  IVC: Appears normal.  Pancreas: No focal abnormality seen.  Spleen: The spleen is normal measuring 4.8 cm sagittally.  Right Kidney: No hydronephrosis is seen. The right kidney  measures 10.5 cm sagittally.  Left Kidney: No hydronephrosis is noted. The left kidney measures  9.6 cm.  Abdominal aorta: The abdominal aorta is normal in caliber.  IMPRESSION:  1. No gallstones. No ductal dilatation.  2. No hepatic abnormality is seen.   Assessment    RUQ pain    Plan    She has a variety of different clinical symptoms. I think some of these may be related to her gallbladder. She has a normal ultrasound at this point. She is due to get a HIDA scan on Wednesday and I think the best plan would be to follow up with her after that to see if this is abnormal. We discussed the indications for cholecystectomy as well as the degree of success depending on what her HIDA scan lookslike. We discussed a laparoscopic cholecystectomy as well as the risks and benefits associated with it as well as the  postoperative course. I think we will touch base again after the HIDA and proceed from there.       Kassidie Hendriks 02/07/2012, 4:13 PM

## 2012-02-09 ENCOUNTER — Encounter (HOSPITAL_COMMUNITY)
Admission: RE | Admit: 2012-02-09 | Discharge: 2012-02-09 | Disposition: A | Discharge status: Home / Self Care | Payer: PRIVATE HEALTH INSURANCE | Source: Ambulatory Visit | Attending: Internal Medicine | Admitting: Internal Medicine

## 2012-02-09 DIAGNOSIS — R110 Nausea: Secondary | ICD-10-CM

## 2012-02-09 DIAGNOSIS — R1013 Epigastric pain: Secondary | ICD-10-CM | POA: Insufficient documentation

## 2012-02-09 MED ORDER — TECHNETIUM TC 99M MEBROFENIN IV KIT
5.2000 | PACK | Freq: Once | INTRAVENOUS | Status: AC | PRN
  Administered 2012-02-09: 5 via INTRAVENOUS

## 2012-02-11 ENCOUNTER — Telehealth (INDEPENDENT_AMBULATORY_CARE_PROVIDER_SITE_OTHER): Payer: Self-pay

## 2012-02-11 NOTE — Telephone Encounter (Signed)
LMOM giving the pt a f/u appt with Dr Dwain Sarna for 2/6 arrive at 4:15 to discuss gallbladder surgery per Dr Dwain Sarna.

## 2012-03-01 ENCOUNTER — Telehealth: Payer: Self-pay | Admitting: Internal Medicine

## 2012-03-01 MED ORDER — RIFAXIMIN 550 MG PO TABS: 550.0000 mg | ORAL_TABLET | Freq: Two times a day (BID) | ORAL | Status: DC

## 2012-03-01 NOTE — Telephone Encounter (Signed)
Sent Xifaxan to pt's new pharmacy.  [facility].  They will notify pt when Rx is ready.

## 2012-03-02 ENCOUNTER — Ambulatory Visit (INDEPENDENT_AMBULATORY_CARE_PROVIDER_SITE_OTHER): Payer: PRIVATE HEALTH INSURANCE | Admitting: General Surgery

## 2012-03-02 ENCOUNTER — Encounter (INDEPENDENT_AMBULATORY_CARE_PROVIDER_SITE_OTHER): Payer: Self-pay | Admitting: General Surgery

## 2012-03-02 VITALS — BP 90/56 | HR 72 | Temp 98.7°F | Resp 12 | Ht 64.0 in | Wt 161.0 lb

## 2012-03-02 DIAGNOSIS — K828 Other specified diseases of gallbladder: Secondary | ICD-10-CM

## 2012-03-02 NOTE — Progress Notes (Signed)
Patient ID: Janice Lyons, female   DOB: 11-08-1975, 37 y.o.   MRN: [id number]  Chief Complaint  Patient presents with  . Re-evaluation    discuss GB Sx    HPI Janice Lyons is a 37 y.o. female.   HPI 23 yof who I saw recently with ruq pain and other associated symptoms as documented in my prior note.  She did not have stones on u/s.  Since our visit she has now had a HIDA scan that shows biliary patency.  The EF is very slightly low at 28%. She reports that fairly soon after drinking half and half she developed ruq pain that worsened about an hour after leaving the hospital.  This required her to stop at her moms house on way back to work and she had several episodes of loose stools. It took a while for this to resolve.  She comes back in today with same symptoms to discuss options after HIDA results are back.  Past Medical History  Diagnosis Date  . TMJ (dislocation of temporomandibular joint)   . Endometriosis   . Yeast infection   . Interstitial cystitis   . Complication of anesthesia     severe facial itching  . GERD (gastroesophageal reflux disease)   . Gastritis   . Chills   . Nasal congestion   . Chest pain   . Constipation   . Abdominal distension   . Abdominal pain   . Nausea   . Weakness   . Generalized headaches   . Bruises easily     Past Surgical History  Procedure Date  . Abdominal hysterectomy   . Bilateral temporomandibular joint arthroplasty   . Breast surgery   . Wisdom tooth extraction   . Diagnostic laparoscopy   . Laparotomy   . Vaginal delivery 1998  . Laparoscopy 12/13/2011    Procedure: LAPAROSCOPY OPERATIVE;  Surgeon: Leslie Andrea, MD;  Location: [facility];  Service: Gynecology;  Laterality: N/A;  . Ovarian cyst removal 12/13/2011    Procedure: OVARIAN CYSTECTOMY;  Surgeon: Leslie Andrea, MD;  Location: [facility];  Service: Gynecology;  Laterality: Right;  . Laparoscopic lysis of adhesions 12/13/2011    Procedure: LAPAROSCOPIC LYSIS  OF ADHESIONS;  Surgeon: Leslie Andrea, MD;  Location: [facility];  Service: Gynecology;  Laterality: N/A;    Family History  Problem Relation Age of Onset  . Diabetes Maternal Grandfather   . Leukemia Maternal Grandfather   . Cancer Maternal Grandfather     leukemia  . Thyroid cancer Father   . Stroke Father   . Hypertension Mother   . Cancer Paternal Grandmother     does not what type    Social History History  Substance Use Topics  . Smoking status: Former Smoker    Quit date: 11/01/1993  . Smokeless tobacco: Never Used  . Alcohol Use: Yes     Comment: rarely    Allergies  Allergen Reactions  . Oxycodone Itching    Facial itching  . Uribel (Uretron D-S) Itching, Nausea Only and Palpitations    Current Outpatient Prescriptions  Medication Sig Dispense Refill  . dexlansoprazole (DEXILANT) 60 MG capsule Take 1 capsule (60 mg total) by mouth daily.  90 capsule  3  . pentosan polysulfate (ELMIRON) 100 MG capsule 100 mg. Bladder injection as needed for interstitial cystitis      . rifaximin (XIFAXAN) 550 MG TABS Take 1 tablet (550 mg total) by mouth 2 (two) times  daily.  60 tablet  0  . saccharomyces boulardii (FLORASTOR) 250 MG capsule Take 250 mg by mouth 2 (two) times daily.        Review of Systems Review of Systems  Blood pressure 90/56, pulse 72, temperature 98.7 F (37.1 C), temperature source Temporal, resp. rate 12, height 5\' 4"  (1.626 m), weight 161 lb (73.029 kg).  Physical Exam Physical Exam  Vitals reviewed. Constitutional: She appears well-developed and well-nourished.  Eyes: No scleral icterus.  Cardiovascular: Normal rate, regular rhythm and normal heart sounds.   Pulmonary/Chest: Effort normal and breath sounds normal.  Abdominal: Soft. Bowel sounds are normal. She exhibits no distension. There is tenderness (mild ruq). There is negative Murphy's sign.      Data Reviewed *RADIOLOGY REPORT*  Clinical Data: Epigastric abdominal pain.  NUCLEAR  MEDICINE HEPATOBILIARY IMAGING WITH GALLBLADDER EF  Technique: Sequential images of the abdomen were obtained out to  60 minutes following intravenous administration of  radiopharmaceutical. After oral ingestion of 8 ounces of Half-and-  Half cream, gallbladder ejection fraction was determined.  Radiopharmaceutical: 5.2 mCi Tc-60m Choletec  Comparison: Abdominal ultrasound 01/28/2012.  Findings: There is symmetric uptake in the liver and prompt  excretion into the biliary tree which is visualized by 10 minutes.  The gallbladder is visualized at . Activity is noted in  the small bowel at 20 minutes.  After oral administration of half-and-half the gallbladder ejection  fraction was estimated at 28%. Normal is greater than 30%.  The patient did experience symptoms after oral ingestion of Half-  and-Half cream.  IMPRESSION:  1. Normal biliary patency study.  2. Low gallbladder ejection fraction at 28%.   Assessment    Biliary dyskinesia    Plan    I told her that I think some of her symptoms are likely referable to her gallbladder and the hida scan with symptoms she describes makes me feel a little better about discussing surgery.  I think there is reasonable chance this will help or cure some of her symptoms.  I think she does have some reflux as well that I don't know this will help.  I did tell her also that there is a chance that this might not help at all. She voiced her understanding.  We discussed observation vs lap chole.  She would like to try and proceed with lap chole to see if this will help at all.  Again I think this is reasonable.  She had trouble with her infraumbilical incision which has been used several times and this was told to her it might have been stitches or glue.  She states she pulled some suture (which was absorbable) and sounds like she spit it out. This may have been because this was scar.  I will try and go supraumbilical and won't use dermabond although  I doubt that cause the issue. I discussed the procedure in detail.  \  We discussed the risks and benefits of a laparoscopic cholecystectomy and possible cholangiogram including, but not limited to bleeding, infection, injury to surrounding structures such as the intestine or liver, bile leak, retained gallstones, need to convert to an open procedure, prolonged diarrhea, blood clots such as  DVT, common bile duct injury, anesthesia risks, and possible need for additional procedures. We discussed the typical post-operative recovery course.        Antaeus Karel 03/02/2012, 4:56 PM

## 2012-03-25 DIAGNOSIS — K2970 Gastritis, unspecified, without bleeding: Secondary | ICD-10-CM

## 2012-03-25 DIAGNOSIS — K811 Chronic cholecystitis: Secondary | ICD-10-CM

## 2012-03-25 HISTORY — DX: Gastritis, unspecified, without bleeding: K29.70

## 2012-03-25 HISTORY — DX: Chronic cholecystitis: K81.1

## 2012-03-27 ENCOUNTER — Other Ambulatory Visit (INDEPENDENT_AMBULATORY_CARE_PROVIDER_SITE_OTHER): Payer: Self-pay | Admitting: General Surgery

## 2012-03-27 ENCOUNTER — Telehealth (INDEPENDENT_AMBULATORY_CARE_PROVIDER_SITE_OTHER): Payer: Self-pay

## 2012-03-27 DIAGNOSIS — K811 Chronic cholecystitis: Secondary | ICD-10-CM

## 2012-03-27 NOTE — Telephone Encounter (Signed)
The patient's husband called and she is just leaving the hospital.  She was given a Percocet Rx.  It makes her itch.  I spoke to Dr Dwain Sarna and got a rx for Vicodin 5/325 one to two po q 4 hrs prn pain #30 no refill.  I took the Percocet Rx and destroyed it and gave the husband the new Rx.  I told him to call if that doesn't help her pain since Vicodin is less strong.

## 2012-04-14 ENCOUNTER — Encounter (INDEPENDENT_AMBULATORY_CARE_PROVIDER_SITE_OTHER): Payer: Self-pay | Admitting: General Surgery

## 2012-04-14 ENCOUNTER — Ambulatory Visit (INDEPENDENT_AMBULATORY_CARE_PROVIDER_SITE_OTHER): Payer: PRIVATE HEALTH INSURANCE | Admitting: General Surgery

## 2012-04-14 VITALS — BP 108/68 | HR 77 | Temp 97.6°F | Resp 18 | Ht 64.0 in | Wt 158.4 lb

## 2012-04-14 DIAGNOSIS — Z09 Encounter for follow-up examination after completed treatment for conditions other than malignant neoplasm: Secondary | ICD-10-CM

## 2012-04-14 NOTE — Progress Notes (Signed)
Subjective:     Patient ID: Janice Lyons, female   DOB: 1975/03/10, 37 y.o.   MRN: [id number]  HPI 52 yof s/p lap chole for biliary dyskinesia.  She states her symptoms are all basically resolved.  She is very happy with her result.  Her pathology shows that she has chronic cholecystitis.  She has some constipation now. Able to eat anything. Off all meds.  Review of Systems     Objective:   Physical Exam Healing incisions without infection    Assessment:     S/p lap chole     Plan:     I recommended Miralax to her for constipation She is released to all activities. Will f/u with Dr. Rhea Belton as scheduled but this appears to have taken care of symptoms

## 2012-11-24 ENCOUNTER — Telehealth: Payer: Self-pay | Admitting: Internal Medicine

## 2012-11-24 NOTE — Telephone Encounter (Signed)
lmom for pt to call back

## 2012-12-12 ENCOUNTER — Encounter: Payer: Self-pay | Admitting: Internal Medicine

## 2012-12-15 ENCOUNTER — Other Ambulatory Visit (INDEPENDENT_AMBULATORY_CARE_PROVIDER_SITE_OTHER): Payer: PRIVATE HEALTH INSURANCE

## 2012-12-15 ENCOUNTER — Encounter: Payer: Self-pay | Admitting: Internal Medicine

## 2012-12-15 ENCOUNTER — Ambulatory Visit (INDEPENDENT_AMBULATORY_CARE_PROVIDER_SITE_OTHER): Payer: PRIVATE HEALTH INSURANCE | Admitting: Internal Medicine

## 2012-12-15 VITALS — BP 100/70 | HR 86 | Ht 64.5 in | Wt 157.0 lb

## 2012-12-15 DIAGNOSIS — R194 Change in bowel habit: Secondary | ICD-10-CM

## 2012-12-15 DIAGNOSIS — K625 Hemorrhage of anus and rectum: Secondary | ICD-10-CM

## 2012-12-15 DIAGNOSIS — R195 Other fecal abnormalities: Secondary | ICD-10-CM

## 2012-12-15 DIAGNOSIS — R198 Other specified symptoms and signs involving the digestive system and abdomen: Secondary | ICD-10-CM

## 2012-12-15 DIAGNOSIS — R109 Unspecified abdominal pain: Secondary | ICD-10-CM

## 2012-12-15 LAB — CBC
Hemoglobin: 13.3 g/dL (ref 12.0–15.0)
MCV: 87.6 fl (ref 78.0–100.0)
Platelets: 171 10*3/uL (ref 150.0–400.0)
RBC: 4.45 Mil/uL (ref 3.87–5.11)
WBC: 6.7 10*3/uL (ref 4.5–10.5)

## 2012-12-15 LAB — COMPREHENSIVE METABOLIC PANEL
Albumin: 4 g/dL (ref 3.5–5.2)
BUN: 6 mg/dL (ref 6–23)
CO2: 26 mEq/L (ref 19–32)
Calcium: 9 mg/dL (ref 8.4–10.5)
Chloride: 104 mEq/L (ref 96–112)
GFR: 89.94 mL/min (ref >60.00)
Glucose, Bld: 99 mg/dL (ref 70–99)
Potassium: 3.5 mEq/L (ref 3.5–5.1)
Sodium: 136 mEq/L (ref 135–145)
Total Protein: 6.9 g/dL (ref 6.0–8.3)

## 2012-12-15 LAB — IGA: IgA: 263 mg/dL (ref 68–378)

## 2012-12-15 LAB — HIGH SENSITIVITY CRP: CRP, High Sensitivity: 0.23 mg/L (ref 0.000–5.000)

## 2012-12-15 LAB — SEDIMENTATION RATE: Sed Rate: 12 mm/hr (ref 0–22)

## 2012-12-15 LAB — TSH: TSH: 1.02 u[IU]/mL (ref 0.35–5.50)

## 2012-12-15 MED ORDER — DICYCLOMINE HCL 10 MG PO CAPS: 10.0000 mg | ORAL_CAPSULE | Freq: Three times a day (TID) | ORAL | Status: DC

## 2012-12-15 MED ORDER — MOVIPREP 100 G PO SOLR: ORAL | Status: DC

## 2012-12-15 MED ORDER — ONDANSETRON HCL 4 MG PO TABS: 4.0000 mg | ORAL_TABLET | Freq: Three times a day (TID) | ORAL | Status: DC | PRN

## 2012-12-15 NOTE — Progress Notes (Signed)
Subjective:    Patient ID: Janice Lyons, female    DOB: [birth year], 37 y.o.   MRN: [id number]  HPI Janice Lyons is a 37 year old female with a past medical history of endometriosis status post hysterectomy and single oophorectomy, interstitial cystitis, GERD, and chronic cholecystitis status post cholecystectomy who seen in followup. She was last seen in January 2014.  She was previously seen for abdominal bloating, but later developed more upper abdominal discomfort. HIDA scan was performed which revealed biliary dyskinesia and Dr. Dwain Sarna performed cholecystectomy in early spring 2014. She reports this helped dramatically with her upper abdominal symptoms. Previously she was treated with rifaximin which helped her abdominal bloating dramatically. She has remained on omeprazole 20 mg daily and reports she she never has heartburn with this medication. She does report developing diarrhea after cholecystectomy.  She tried changing her diet but this didn't seem to help. She reports cycling through multiple probiotics but finally found a probiotic which helped her stools. This is probiotic gummies which she is still using.  However over the last one to 2 months she has developed a change in her bowel habits along with lower abdominal cramping pain. She reports her bowel movements are quite erratic with alternating diarrhea and constipation. With this she often has intense lower abdominal cramping. Her lower abdominal cramping can come in "attacks". Her last such attack occurred in the afternoon and almost resulted in an ER visit.  She did use a Dulcolax suppository which produced bowel movements and eventually loose stools. Subsequent to this she saw bloody stools. She has continued to see painless rectal bleeding with stools and off-and-on fashion. Since that last attack she has had some right lower quadrant abdominal pain which has been fairly consistent. She has seen some mucus in her stool as well. She  reports intermittent nausea and she has used some of her husband Zofran with good result. No fevers or chills. No weight loss. She has 3 foods which definitely trigger her symptoms and they are peanut butter, rice and any pasta.  She is strictly avoiding these foods.  She does endorse a great of stress in her life on a near daily basis but she has not think all of her current symptoms are stress-related or worsened by stress  Review of Systems As per history of present illness, otherwise negative  Current Medications, Allergies, Past Medical History, Past Surgical History, Family History and Social History were reviewed in [employer] record.     Objective:   Physical Exam BP 100/70  Pulse 86  Ht 5' 4.5" (1.638 m)  Wt 157 lb (71.215 kg)  BMI 26.54 kg/m2 Constitutional: Well-developed and well-nourished. No distress. HEENT: Normocephalic and atraumatic. Oropharynx is clear and moist. No oropharyngeal exudate. Conjunctivae are normal.  No scleral icterus. Neck: Neck supple. Trachea midline. Cardiovascular: Normal rate, regular rhythm and intact distal pulses. No M/R/G Pulmonary/chest: Effort normal and breath sounds normal. No wheezing, rales or rhonchi. Abdominal: Soft, lower and right lower quadrant abdominal tenderness without rebound or guarding, nondistended. Bowel sounds active throughout. There are no masses palpable.  Extremities: no clubbing, cyanosis, or edema Lymphadenopathy: No cervical adenopathy noted. Neurological: Alert and oriented to person place and time. Skin: Skin is warm and dry. No rashes noted. Psychiatric: Normal mood and affect. Behavior is normal.  CBC    Component Value Date/Time   WBC 6.7 12/15/2012 1303   RBC 4.45 12/15/2012 1303   HGB 13.3 12/15/2012 1303   HCT 39.0 12/15/2012 1303  PLT 171.0 12/15/2012 1303   MCV 87.6 12/15/2012 1303   MCH 29.8 12/10/2011 0829   MCHC 34.1 12/15/2012 1303   RDW 13.4 12/15/2012 1303    LYMPHSABS 2.1 01/28/2012 0851   MONOABS 0.5 01/28/2012 0851   EOSABS 0.2 01/28/2012 0851   BASOSABS 0.0 01/28/2012 0851    CMP     Component Value Date/Time   NA 136 12/15/2012 1303   K 3.5 12/15/2012 1303   CL 104 12/15/2012 1303   CO2 26 12/15/2012 1303   GLUCOSE 99 12/15/2012 1303   BUN 6 12/15/2012 1303   CREATININE 0.8 12/15/2012 1303   CALCIUM 9.0 12/15/2012 1303   PROT 6.9 12/15/2012 1303   ALBUMIN 4.0 12/15/2012 1303   AST 16 12/15/2012 1303   ALT 16 12/15/2012 1303   ALKPHOS 31* 12/15/2012 1303   BILITOT 0.5 12/15/2012 1303   GFRNONAA >90 08/20/2011 1530   GFRAA >90 08/20/2011 1530   Erythrocyte Sedimentation Rate     Component Value Date/Time   ESRSEDRATE 12 12/15/2012 1303        Assessment & Plan:  37 year old female with a past medical history of endometriosis status post hysterectomy and single oophorectomy, interstitial cystitis, GERD, and chronic cholecystitis status post cholecystectomy who seen in followup  1.  Lower abd pain/blood per rectum/change in bowel habits -- some of her symptoms seem irritable in nature however the blood in her stool certainly is not. I recommended colonoscopy for further evaluation of her rectal bleeding change in bowel habits. After discussing the risks and benefits she is agreeable to proceed. I will give her a prescription for Bentyl to be used as directed and as needed for lower abdominal cramping. I also will give her a prescription for Zofran to be used as needed and asked record for nausea. I will repeat labs today to include CBC, CMP, TSH, celiac panel, ESR and CRP.  Of note her previous upper endoscopy revealed no evidence for celiac disease with negative small bowel biopsies.  I also have recommended that she return to see her GYN physician given her history of pelvic surgery and endometriosis. She feels this is a good idea. Also for now she will hold on her daily probiotic to see if this dramatically changes her bowel habits.

## 2012-12-15 NOTE — Patient Instructions (Signed)
You have been scheduled for a colonoscopy with propofol. Please follow written instructions given to you at your visit today.  Please pick up your prep kit at the pharmacy within the next 1-3 days. If you use inhalers (even only as needed), please bring them with you on the day of your procedure. Your physician has requested that you go to www.startemmi.com and enter the access code given to you at your visit today. This web site gives a general overview about your procedure. However, you should still follow specific instructions given to you by our office regarding your preparation for the procedure.  Your physician has requested that you go to the basement for  lab work before leaving today.  We have sent the following medications to your pharmacy for you to pick up at your convenience: Bentyl, Zofran                                               We are excited to introduce MyChart, a new best-in-class service that provides you online access to important information in your electronic medical record. We want to make it easier for you to view your health information - all in one secure location - when and where you need it. We expect MyChart will enhance the quality of care and service we provide.  When you register for MyChart, you can:    View your test results.    Request appointments and receive appointment reminders via email.    Request medication renewals.    View your medical history, allergies, medications and immunizations.    Communicate with your physician's office through a password-protected site.    Conveniently print information such as your medication lists.  To find out if MyChart is right for you, please talk to a member of our clinical staff today. We will gladly answer your questions about this free health and wellness tool.  If you are age 36 or older and want a member of your family to have access to your record, you must provide written consent by completing a  proxy form available at our office. Please speak to our clinical staff about guidelines regarding accounts for patients younger than age 84.  As you activate your MyChart account and need any technical assistance, please call the MyChart technical support line at [phone]) [phone]) or email your question to [email or web address]. If you email your question(s), please include your name, a return phone number and the best time to reach you.  If you have non-urgent health-related questions, you can send a message to our office through MyChart at [email or web address]. If you have a medical emergency, call 911.  Thank you for using MyChart as your new health and wellness resource!   MyChart licensed from [employer],  4540-9811. Patents Pending.

## 2012-12-18 LAB — TISSUE TRANSGLUTAMINASE, IGA: Tissue Transglutaminase Ab, IgA: 4.4 U/mL (ref <20)

## 2012-12-25 ENCOUNTER — Encounter: Payer: Self-pay | Admitting: Internal Medicine

## 2012-12-25 ENCOUNTER — Ambulatory Visit (AMBULATORY_SURGERY_CENTER): Payer: PRIVATE HEALTH INSURANCE | Admitting: Internal Medicine

## 2012-12-25 VITALS — BP 109/66 | HR 81 | Temp 97.6°F | Resp 16 | Ht 64.5 in | Wt 157.0 lb

## 2012-12-25 DIAGNOSIS — R198 Other specified symptoms and signs involving the digestive system and abdomen: Secondary | ICD-10-CM

## 2012-12-25 DIAGNOSIS — R141 Gas pain: Secondary | ICD-10-CM

## 2012-12-25 DIAGNOSIS — R140 Abdominal distension (gaseous): Secondary | ICD-10-CM

## 2012-12-25 DIAGNOSIS — D126 Benign neoplasm of colon, unspecified: Secondary | ICD-10-CM

## 2012-12-25 DIAGNOSIS — K625 Hemorrhage of anus and rectum: Secondary | ICD-10-CM

## 2012-12-25 MED ORDER — SODIUM CHLORIDE 0.9 % IV SOLN: 500.0000 mL | INTRAVENOUS | Status: DC

## 2012-12-25 MED ORDER — MESALAMINE 4 G RE ENEM: 4.0000 g | ENEMA | Freq: Every day | RECTAL | Status: DC

## 2012-12-25 NOTE — Progress Notes (Signed)
Patient did not experience any of the following events: a burn prior to discharge; a fall within the facility; wrong site/side/patient/procedure/implant event; or a hospital transfer or hospital admission upon discharge from the facility. (G8907) Patient did not have preoperative order for IV antibiotic SSI prophylaxis. (G8918)  

## 2012-12-25 NOTE — Patient Instructions (Signed)
Colon polyps x 3 removed today, and colitis seen. Biopsies taken today. Handout given on polyps.  Resume current medications. Pick up new medication Rowasa, sent to your pharmacy, take as directed. Florastor (over the counter) twice daily. Return office visit in 1 month, call office at # [phone] to make appointment. Call us if needed. Thank you!!  YOU HAD AN ENDOSCOPIC PROCEDURE TODAY AT THE [facility]: Refer to the procedure report that was given to you for any specific questions about what was found during the examination.  If the procedure report does not answer your questions, please call your gastroenterologist to clarify.  If you requested that your care partner not be given the details of your procedure findings, then the procedure report has been included in a sealed envelope for you to review at your convenience later.  YOU SHOULD EXPECT: Some feelings of bloating in the abdomen. Passage of more gas than usual.  Walking can help get rid of the air that was put into your GI tract during the procedure and reduce the bloating. If you had a lower endoscopy (such as a colonoscopy or flexible sigmoidoscopy) you may notice spotting of blood in your stool or on the toilet paper. If you underwent a bowel prep for your procedure, then you may not have a normal bowel movement for a few days.  DIET: Your first meal following the procedure should be a light meal and then it is ok to progress to your normal diet.  A half-sandwich or bowl of soup is an example of a good first meal.  Heavy or fried foods are harder to digest and may make you feel nauseous or bloated.  Likewise meals heavy in dairy and vegetables can cause extra gas to form and this can also increase the bloating.  Drink plenty of fluids but you should avoid alcoholic beverages for 24 hours.  ACTIVITY: Your care partner should take you home directly after the procedure.  You should plan to take it easy, moving slowly for the rest  of the day.  You can resume normal activity the day after the procedure however you should NOT DRIVE or use heavy machinery for 24 hours (because of the sedation medicines used during the test).    SYMPTOMS TO REPORT IMMEDIATELY: A gastroenterologist can be reached at any hour.  During normal business hours, 8:30 AM to 5:00 PM Monday through Friday, call [phone].  After hours and on weekends, please call the GI answering service at [phone] who will take a message and have the physician on call contact you.   Following lower endoscopy (colonoscopy or flexible sigmoidoscopy):  Excessive amounts of blood in the stool  Significant tenderness or worsening of abdominal pains  Swelling of the abdomen that is new, acute  Fever of 100F or higher  Following upper endoscopy (EGD)  Vomiting of blood or coffee ground material  New chest pain or pain under the shoulder blades  Painful or persistently difficult swallowing  New shortness of breath  Fever of 100F or higher  Black, tarry-looking stools  FOLLOW UP: If any biopsies were taken you will be contacted by phone or by letter within the next 1-3 weeks.  Call your gastroenterologist if you have not heard about the biopsies in 3 weeks.  Our staff will call the home number listed on your records the next business day following your procedure to check on you and address any questions or concerns that you may have at that  time regarding the information given to you following your procedure. This is a courtesy call and so if there is no answer at the home number and we have not heard from you through the emergency physician on call, we will assume that you have returned to your regular daily activities without incident.  SIGNATURES/CONFIDENTIALITY: You and/or your care partner have signed paperwork which will be entered into your electronic medical record.  These signatures attest to the fact that that the information above on your After  Visit Summary has been reviewed and is understood.  Full responsibility of the confidentiality of this discharge information lies with you and/or your care-partner.

## 2012-12-25 NOTE — Progress Notes (Signed)
Procedure ends, to recovery, report given and VSS. 

## 2012-12-25 NOTE — Op Note (Signed)
[facility] [address] [place], [address]   COLONOSCOPY PROCEDURE REPORT  PATIENT: Janice Lyons, Janice Lyons  MR#: [id number] BIRTHDATE: 04/14/1975 , 37  yrs. old GENDER: Female ENDOSCOPIST: Beverley Fiedler, MD PROCEDURE DATE:  12/25/2012 PROCEDURE:   Colonoscopy with biopsy and Colonoscopy with snare polypectomy First Screening Colonoscopy - Avg.  risk and is 50 yrs.  old or older - No.  Prior Negative Screening - Now for repeat screening. N/A  History of Adenoma - Now for follow-up colonoscopy & has been > or = to 3 yrs.  N/A  Polyps Removed Today? Yes. ASA CLASS:   Class II INDICATIONS:Rectal Bleeding, Change in bowel habits, and Abdominal pain. MEDICATIONS: MAC sedation, administered by CRNA and propofol (Diprivan) 350mg  IV  DESCRIPTION OF PROCEDURE:   After the risks benefits and alternatives of the procedure were thoroughly explained, informed consent was obtained.  A digital rectal exam revealed no rectal mass.   The LB [id number]-[id number]  endoscope was introduced through the anus and advanced to the terminal ileum which was intubated for a short distance. No adverse events experienced.   The quality of the prep was good, using MoviPrep  The instrument was then slowly withdrawn as the colon was fully examined.   COLON FINDINGS: The mucosa appeared normal in the terminal ileum. Two sessile polyps measuring 5 and 7 mm in size with mucous caps were found in the ascending colon and at the splenic flexure. Polypectomy was performed using cold snare.  All resections were complete and all polyp tissue was completely retrieved.   Patchy mild colitis was found in the distal sigmoid colon and rectum.  The mucosa from 25 cm from the dentate line and patchy to the rectum was erythematous and had loss of vascularity and granularity. Multiple biopsies were performed using cold forceps.   A sessile polyp measuring 3 mm in size was found in the rectum.  A polypectomy was  performed with cold forceps.  The resection was complete and the polyp tissue was completely retrieved.  The remainder of the colonic mucosa from the cecum to the mid sigmoid appeared normal.  Random biopsies were performed from the right colon to exclude colitis.      The time to cecum=  2 min, 40 sec. Withdrawal time= 16 min, 59 sec .  The scope was withdrawn and the procedure completed.  COMPLICATIONS: There were no complications.  ENDOSCOPIC IMPRESSION: 1.   Normal mucosa in the terminal ileum 2.   Two sessile polyps measuring 5 and 7 mm in size were found in the ascending colon and at the splenic flexure; Polypectomy was performed using cold snare 3.   Patchy mild colitis found in the sigmoid colon and rectum (25 cm to rectum); multiple biopsies were performed using cold forceps 4.   Sessile polyp measuring 3 mm in size was found in the rectum; polypectomy was performed with cold forceps  RECOMMENDATIONS: 1.  Await pathology results 2.  Timing of repeat colonoscopy will be determined by pathology findings. 3.  Begin Florastor 250 mg twice daily (probiotic) 4.  Begin Rowasa 4 g enema at bedtime 5.  Office follow-up in 1 month   eSigned:  Beverley Fiedler, MD 12/25/2012 9:21 AM   cc: The Patient and Ralene Ok MD   PATIENT NAME:  Janice Lyons, Janice Lyons MR#: [id number]

## 2012-12-25 NOTE — Progress Notes (Signed)
Called to room to assist during endoscopic procedure.  Patient ID and intended procedure confirmed with present staff. Received instructions for my participation in the procedure from the performing physician.  

## 2012-12-26 ENCOUNTER — Telehealth: Payer: Self-pay

## 2012-12-26 NOTE — Telephone Encounter (Signed)
  Follow up Call-  Call back number 12/25/2012 11/10/2011  Post procedure Call Back phone  # [phone]  Permission to leave phone message Yes Yes     Patient questions:  Do you have a fever, pain , or abdominal swelling? no Pain Score  0 *  Have you tolerated food without any problems? yes  Have you been able to return to your normal activities? yes  Do you have any questions about your discharge instructions: Diet   no Medications  no Follow up visit  no  Do you have questions or concerns about your Care? no  Actions: * If pain score is 4 or above: No action needed, pain <4.

## 2012-12-27 ENCOUNTER — Telehealth: Payer: Self-pay | Admitting: Internal Medicine

## 2012-12-29 ENCOUNTER — Encounter: Payer: Self-pay | Admitting: Internal Medicine

## 2013-01-01 ENCOUNTER — Other Ambulatory Visit: Payer: Self-pay | Admitting: Gastroenterology

## 2013-01-01 MED ORDER — MESALAMINE 1000 MG RE SUPP: 1000.0000 mg | Freq: Every day | RECTAL | Status: DC

## 2013-01-01 NOTE — Telephone Encounter (Signed)
Spoke to pt. Told her I will be sending in [place], to call us if this is going to be too expensive. Also told her it has Uretron in it and that shows to be an allergy for her. She said she cant say she is allergic to that since when she had an allergic reaction to a medication she was on so many they couldn't pinpoint which one it was, told her she wants to try it. I told her to call us if she has an allergic reaction. Pt verbalized understanding.

## 2013-01-26 ENCOUNTER — Encounter: Payer: Self-pay | Admitting: Internal Medicine

## 2013-01-30 ENCOUNTER — Ambulatory Visit: Payer: PRIVATE HEALTH INSURANCE | Admitting: Internal Medicine

## 2013-07-05 DIAGNOSIS — K589 Irritable bowel syndrome without diarrhea: Secondary | ICD-10-CM | POA: Insufficient documentation

## 2013-11-26 ENCOUNTER — Encounter: Payer: Self-pay | Admitting: Internal Medicine

## 2014-04-04 ENCOUNTER — Other Ambulatory Visit: Payer: Self-pay | Admitting: Obstetrics and Gynecology

## 2014-04-05 LAB — CYTOLOGY - PAP

## 2014-05-10 DIAGNOSIS — N80129 Deep endometriosis of ovary, unspecified ovary: Secondary | ICD-10-CM | POA: Insufficient documentation

## 2014-05-10 DIAGNOSIS — N801 Endometriosis of ovary: Secondary | ICD-10-CM | POA: Insufficient documentation

## 2015-07-28 ENCOUNTER — Ambulatory Visit (INDEPENDENT_AMBULATORY_CARE_PROVIDER_SITE_OTHER): Payer: Commercial Managed Care - PPO

## 2015-07-28 ENCOUNTER — Ambulatory Visit (INDEPENDENT_AMBULATORY_CARE_PROVIDER_SITE_OTHER): Payer: Commercial Managed Care - PPO | Admitting: Emergency Medicine

## 2015-07-28 VITALS — BP 106/72 | HR 82 | Temp 98.2°F | Resp 18 | Ht 64.5 in | Wt 163.4 lb

## 2015-07-28 DIAGNOSIS — M545 Low back pain, unspecified: Secondary | ICD-10-CM

## 2015-07-28 MED ORDER — CYCLOBENZAPRINE HCL 5 MG PO TABS: 5.0000 mg | ORAL_TABLET | Freq: Three times a day (TID) | ORAL | Status: DC | PRN

## 2015-07-28 NOTE — Patient Instructions (Addendum)
IF you received an x-ray today, you will receive an invoice from [facility]. Please contact [facility] at [phone] with questions or concerns regarding your invoice.   IF you received labwork today, you will receive an invoice from [employer]. Please contact [facility] at [phone] with questions or concerns regarding your invoice.   Our billing staff will not be able to assist you with questions regarding bills from these companies.  You will be contacted with the lab results as soon as they are available. The fastest way to get your results is to activate your My Chart account. Instructions are located on the last page of this paperwork. If you have not heard from [place] regarding the results in 2 weeks, please contact this office.     Lumbosacral Strain Lumbosacral strain is a strain of any of the parts that make up your lumbosacral vertebrae. Your lumbosacral vertebrae are the bones that make up the lower third of your backbone. Your lumbosacral vertebrae are held together by muscles and tough, fibrous tissue (ligaments).  CAUSES  A sudden blow to your back can cause lumbosacral strain. Also, anything that causes an excessive stretch of the muscles in the low back can cause this strain. This is typically seen when people exert themselves strenuously, fall, lift heavy objects, bend, or crouch repeatedly. RISK FACTORS  Physically demanding work.  Participation in pushing or pulling sports or sports that require a sudden twist of the back (tennis, golf, baseball).  Weight lifting.  Excessive lower back curvature.  Forward-tilted pelvis.  Weak back or abdominal muscles or both.  Tight hamstrings. SIGNS AND SYMPTOMS  Lumbosacral strain may cause pain in the area of your injury or pain that moves (radiates) down your leg.  DIAGNOSIS Your health care provider can often diagnose lumbosacral strain through a physical exam. In some  cases, you may need tests such as X-ray exams.  TREATMENT  Treatment for your lower back injury depends on many factors that your clinician will have to evaluate. However, most treatment will include the use of anti-inflammatory medicines. HOME CARE INSTRUCTIONS   Avoid hard physical activities (tennis, racquetball, waterskiing) if you are not in proper physical condition for it. This may aggravate or create problems.  If you have a back problem, avoid sports requiring sudden body movements. Swimming and walking are generally safer activities.  Maintain good posture.  Maintain a healthy weight.  For acute conditions, you may put ice on the injured area.  Put ice in a plastic bag.  Place a towel between your skin and the bag.  Leave the ice on for 20 minutes, 2-3 times a day.  When the low back starts healing, stretching and strengthening exercises may be recommended. SEEK MEDICAL CARE IF:  Your back pain is getting worse.  You experience severe back pain not relieved with medicines. SEEK IMMEDIATE MEDICAL CARE IF:   You have numbness, tingling, weakness, or problems with the use of your arms or legs.  There is a change in bowel or bladder control.  You have increasing pain in any area of the body, including your belly (abdomen).  You notice shortness of breath, dizziness, or feel faint.  You feel sick to your stomach (nauseous), are throwing up (vomiting), or become sweaty.  You notice discoloration of your toes or legs, or your feet get very cold. MAKE SURE YOU:   Understand these instructions.  Will watch your condition.  Will get help right away if  you are not doing well or get worse.   This information is not intended to replace advice given to you by your health care provider. Make sure you discuss any questions you have with your health care provider.   Document Released: 10/21/2004 Document Revised: 02/01/2014 Document Reviewed: 08/30/2012 Elsevier  Interactive Patient Education [employer].

## 2015-07-28 NOTE — Progress Notes (Signed)
By signing my name below, I, Raven Small, attest that this documentation has been prepared under the direction and in the presence of Arlyss Queen, MD.  Electronically Signed: Thea Alken, ED Scribe. 07/28/2015. 10:21 AM.   Chief Complaint:  Chief Complaint  Patient presents with  . Back Injury    x sat. lower back pain, unsure of how she hurt it    HPI: Janice Lyons is a 40 y.o. female who reports to [facility] today complaining of a low back injury that occurred 2 days ago. Pt states while handling her 4 large dogs 2 days ago she believes she may have pulled a muscle in low back. States all 4 dogs together amount to 400 lbs.  She took ibuprofen and resting that evening. Yesterday she had worsening pain, more so in seated position but relief when laying on her side. She tried 2 of her daughters muscle relaxer yesterday with minimal relief. Today, she reports improvement of pain in seated position, but has worsening pain with walking, feeling as if she is going to loose her balance. She has hx of sciatica, with pain radiating down right buttock and thigh, but states this pain is different and that she does not have pain radiating down leg. Pt has hx of hysterectomy.   Past Medical History  Diagnosis Date  . TMJ (dislocation of temporomandibular joint)   . Endometriosis   . Yeast infection   . Interstitial cystitis   . Complication of anesthesia     severe facial itching  . GERD (gastroesophageal reflux disease)   . Gastritis   . Chills   . Nasal congestion   . Chest pain   . Constipation   . Abdominal distension   . Abdominal pain   . Nausea   . Weakness   . Generalized headaches   . Bruises easily   . Chronic cholecystitis 03/2012  . Gastritis 03/2012  . Serrated adenoma of colon   . Hyperplastic colon polyp   . Microscopic colitis   . Allergy    Past Surgical History  Procedure Laterality Date  . Abdominal hysterectomy    . Bilateral temporomandibular joint arthroplasty     . Breast surgery    . Wisdom tooth extraction    . Diagnostic laparoscopy    . Laparotomy    . Vaginal delivery  1998  . Laparoscopy  12/13/2011    Procedure: LAPAROSCOPY OPERATIVE;  Surgeon: Allena Katz, MD;  Location: [facility];  Service: Gynecology;  Laterality: N/A;  . Ovarian cyst removal  12/13/2011    Procedure: OVARIAN CYSTECTOMY;  Surgeon: Allena Katz, MD;  Location: [facility];  Service: Gynecology;  Laterality: Right;  . Laparoscopic lysis of adhesions  12/13/2011    Procedure: LAPAROSCOPIC LYSIS OF ADHESIONS;  Surgeon: Allena Katz, MD;  Location: [facility];  Service: Gynecology;  Laterality: N/A;   Social History   Social History  . Marital Status: Married    Spouse Name: N/A  . Number of Children: 1  . Years of Education: N/A   Occupational History  .  [employer]   Social History Main Topics  . Smoking status: Current Every Day Smoker    Last Attempt to Quit: 11/01/1993  . Smokeless tobacco: Never Used  . Alcohol Use: 0.0 oz/week    0 Standard drinks or equivalent per week     Comment: rarely  . Drug Use: No  . Sexual Activity: Not Asked  Other Topics Concern  . None   Social History Narrative   Family History  Problem Relation Age of Onset  . Diabetes Maternal Grandfather   . Leukemia Maternal Grandfather   . Cancer Maternal Grandfather     leukemia  . Thyroid cancer Father   . Stroke Father   . Cancer Father   . Heart disease Father   . Hypertension Mother   . Cancer Paternal Grandmother     does not what type   Allergies  Allergen Reactions  . Oxycodone Itching    Facial itching  . Harlin Heys D-S] Itching, Nausea Only and Palpitations   Prior to Admission medications   Medication Sig Start Date End Date Taking? Authorizing Provider  cetirizine (ZYRTEC) 10 MG tablet Take 10 mg by mouth daily.   Yes Historical Provider, MD  Magnesium 400 MG CAPS Take by mouth.   Yes Historical Provider, MD  Multiple Vitamin (MULTI  VITAMIN DAILY PO) Take by mouth.   Yes Historical Provider, MD  omeprazole (PRILOSEC) 20 MG capsule Take 20 mg by mouth daily.   Yes Historical Provider, MD  UNABLE TO FIND Vitamin D   Yes Historical Provider, MD  dicyclomine (BENTYL) 10 MG capsule Take 1 capsule (10 mg total) by mouth 4 (four) times daily -  before meals and at bedtime. Patient not taking: Reported on 07/28/2015 12/15/12   Jerene Bears, MD  mesalamine (CANASA) 1000 MG suppository Place 1 suppository (1,000 mg total) rectally at bedtime. Patient not taking: Reported on 07/28/2015 01/01/13   Jerene Bears, MD     ROS: The patient denies fevers, chills, night sweats, unintentional weight loss, chest pain, palpitations, wheezing, dyspnea on exertion, nausea, vomiting, abdominal pain, dysuria, hematuria, melena, numbness, weakness, or tingling.   All other systems have been reviewed and were otherwise negative with the exception of those mentioned in the HPI and as above.    PHYSICAL EXAM: Filed Vitals:   07/28/15 0955  BP: 106/72  Pulse: 82  Temp: 98.2 F (36.8 C)  Resp: 18   Body mass index is 27.62 kg/(m^2).   General: Alert, no acute distress HEENT:  Normocephalic, atraumatic, oropharynx patent. Eye: Juliette Mangle [facility] Cardiovascular:  Regular rate and rhythm, no rubs murmurs or gallops.  No Carotid bruits, radial pulse intact. No pedal edema.  Respiratory: Clear to auscultation bilaterally.  No wheezes, rales, or rhonchi.  No cyanosis, no use of accessory musculature Abdominal: No organomegaly, abdomen is soft and non-tender, positive bowel sounds.  No masses. Musculoskeletal: Gait intact. No edema. Pain at L5-S1 midline. Pain with straight leg raising.  Skin: No rashes. Neurologic: Facial musculature symmetric. Psychiatric: Patient acts appropriately throughout our interaction. Lymphatic: No cervical or submandibular lymphadenopathy   EKG/XRAY:    Dg Lumbar Spine Complete  07/28/2015  CLINICAL DATA:  Low back pain  since an injury 2 days ago. EXAM: LUMBAR SPINE - COMPLETE 4+ VIEW COMPARISON:  CT scan of the abdomen and pelvis dated 08/20/2011 FINDINGS: There is no evidence of lumbar spine fracture. Alignment is normal. Intervertebral disc spaces are maintained. IMPRESSION: Normal exam. Electronically Signed   By: Lorriane Shire M.D.   On: 07/28/2015 11:03    ASSESSMENT/PLAN: Back films are normal. All of her GI symptoms resolved once she had surgery for endometriosis. She will take 2 Aleve twice a day with food. She was also given a prescription for Flexeril 5 mg and cautioned regarding drowsiness.I personally performed the services described in this documentation, which was scribed  in my presence. The recorded information has been reviewed and is accurate.   Gross sideeffects, risk and benefits, and alternatives of medications d/w patient. Patient is aware that all medications have potential sideeffects and we are unable to predict every sideeffect or drug-drug interaction that may occur.  Arlyss Queen MD 07/28/2015 10:21 AM

## 2015-10-13 ENCOUNTER — Encounter: Payer: Self-pay | Admitting: *Deleted

## 2015-11-05 ENCOUNTER — Encounter: Payer: Self-pay | Admitting: Internal Medicine

## 2015-12-02 DIAGNOSIS — R140 Abdominal distension (gaseous): Secondary | ICD-10-CM | POA: Insufficient documentation

## 2015-12-11 DIAGNOSIS — F17209 Nicotine dependence, unspecified, with unspecified nicotine-induced disorders: Secondary | ICD-10-CM | POA: Insufficient documentation

## 2015-12-11 DIAGNOSIS — F17210 Nicotine dependence, cigarettes, uncomplicated: Secondary | ICD-10-CM | POA: Insufficient documentation

## 2017-01-17 IMAGING — DX DG LUMBAR SPINE COMPLETE 4+V
5 series · 5 of 5 positions shown · non-contrast
Comparison: CT scan of the abdomen and pelvis dated 08/20/2011

CLINICAL DATA: Low back pain since an injury 2 days ago.

EXAM:
LUMBAR SPINE - COMPLETE 4+ VIEW

[l-spine ap]
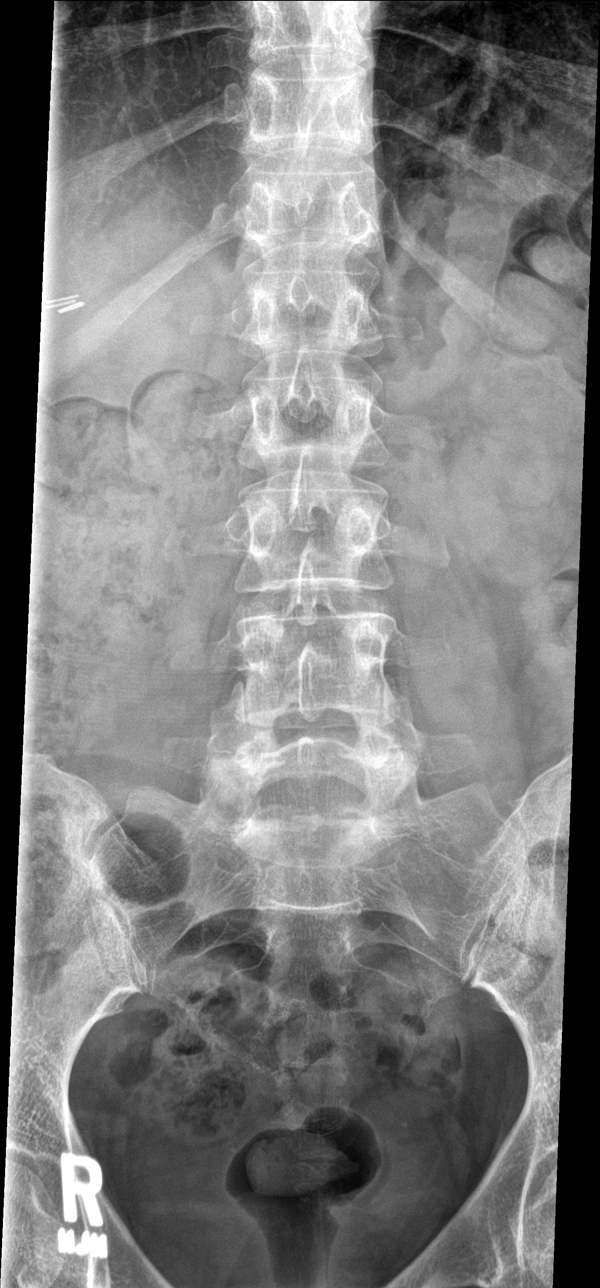

[l-spine obl (1 of 2)]
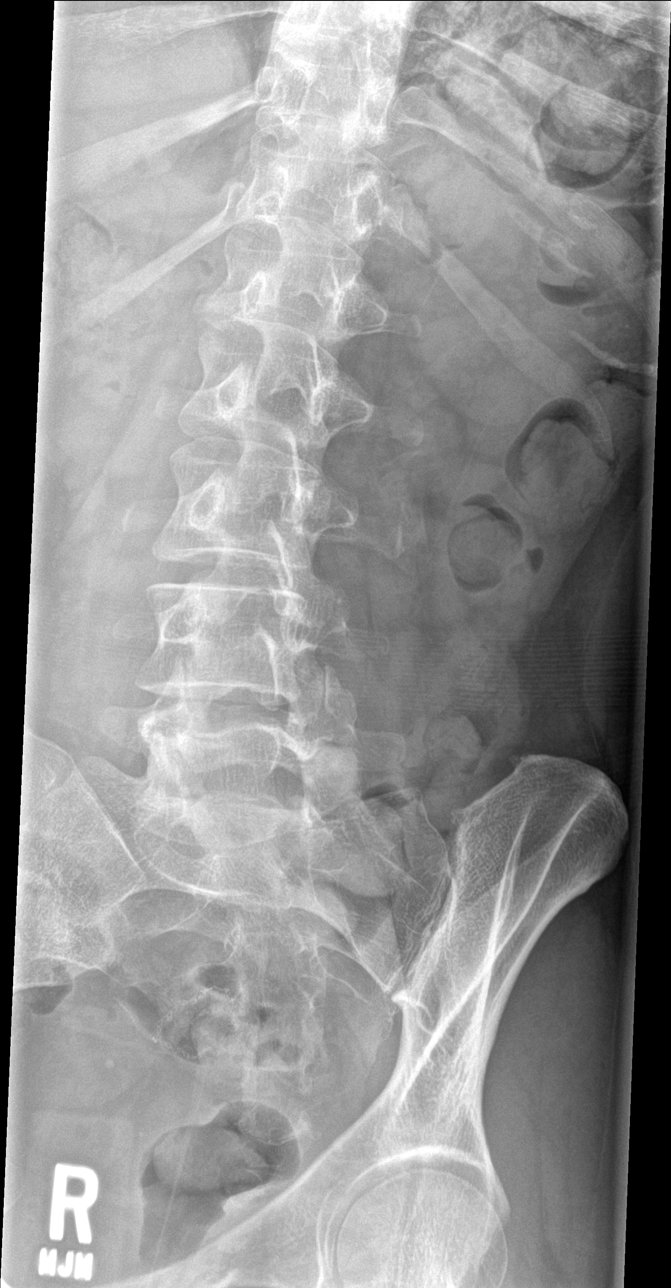

[l-spine obl (2 of 2)]
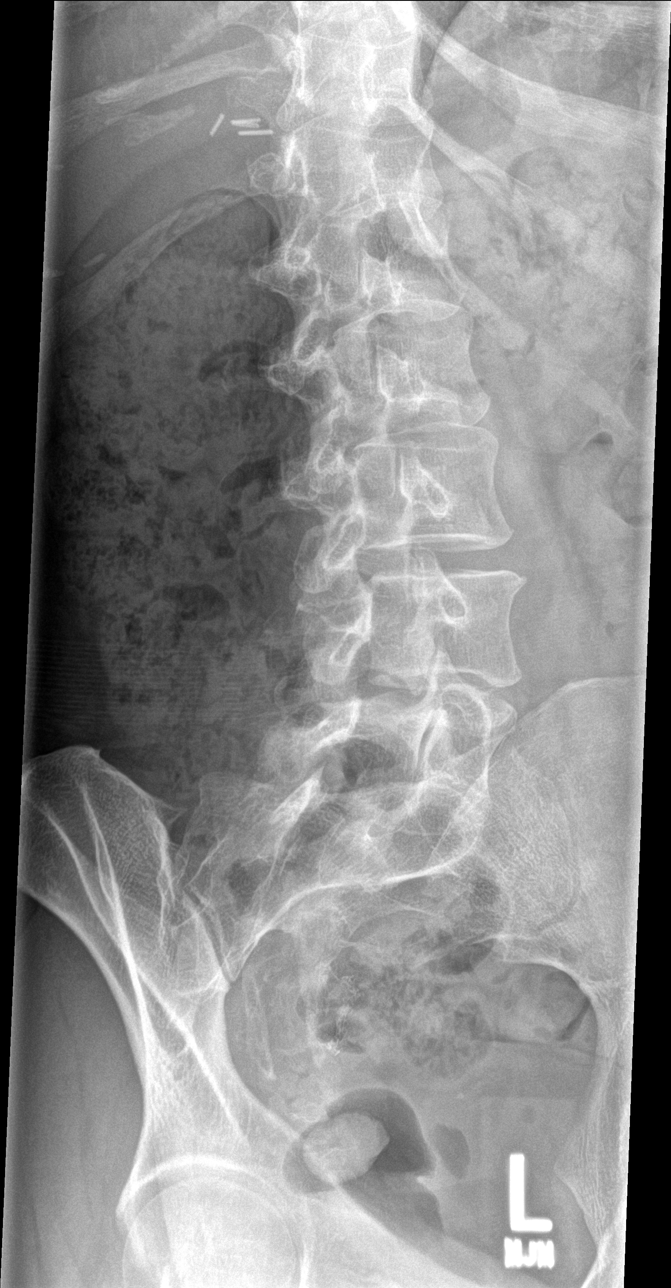

[l-spine lat]
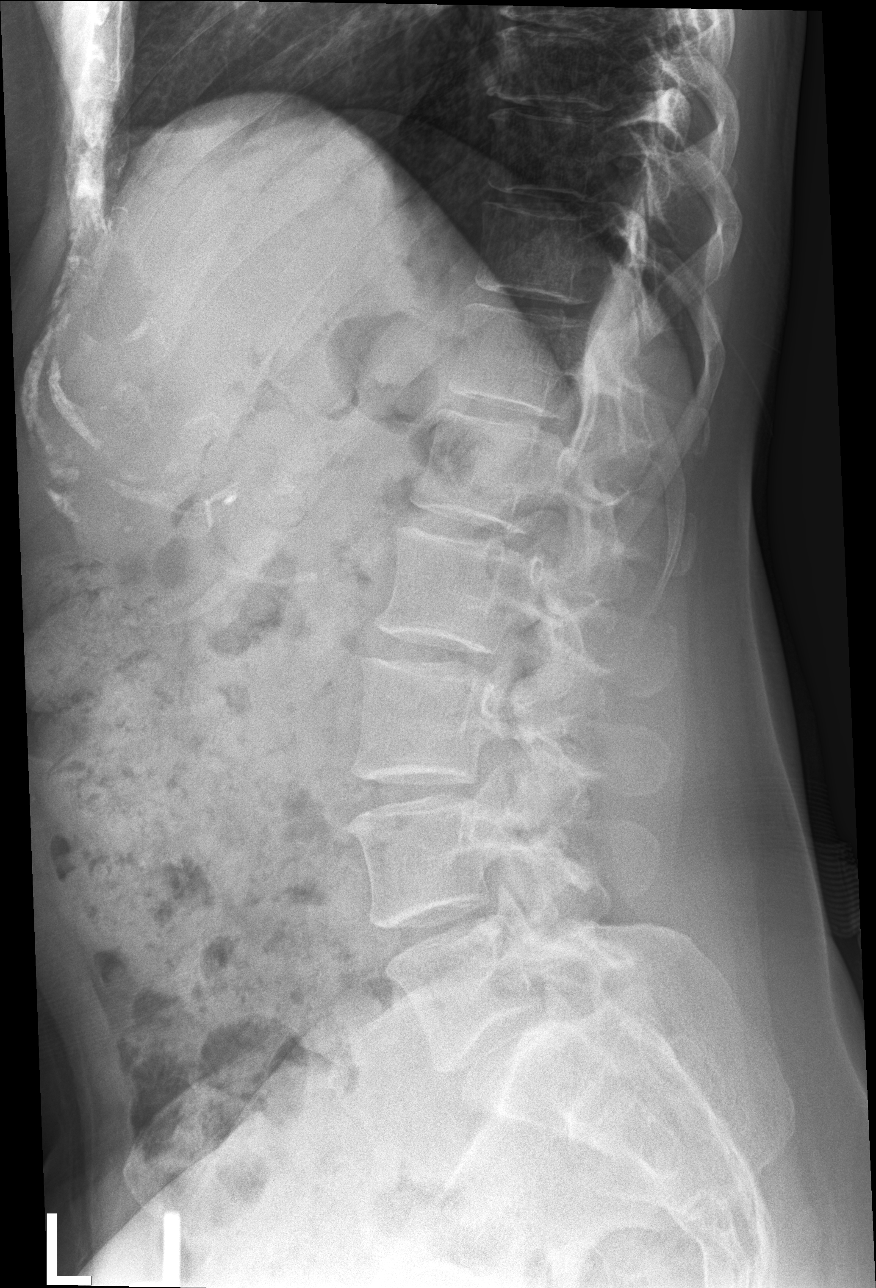

[l-spine l5-s1]
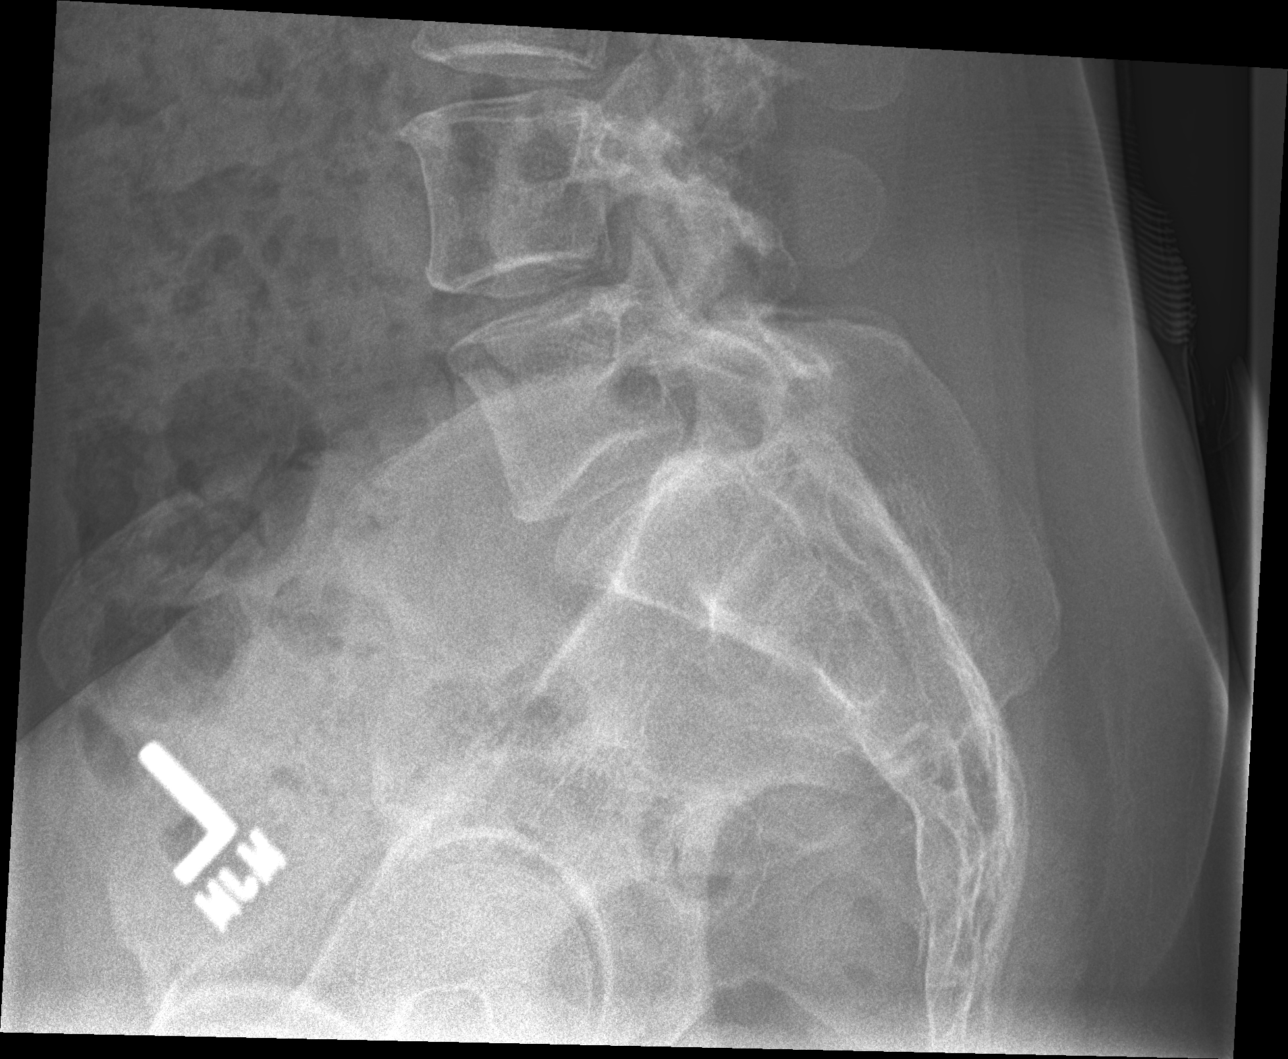

[5 of 5 positions shown; findings below may reference images not displayed]

FINDINGS: There is no evidence of lumbar spine fracture. Alignment is normal.
Intervertebral disc spaces are maintained.
IMPRESSION: Normal exam.

## 2017-05-17 ENCOUNTER — Telehealth: Payer: Self-pay | Admitting: Family Medicine

## 2017-05-17 ENCOUNTER — Ambulatory Visit (INDEPENDENT_AMBULATORY_CARE_PROVIDER_SITE_OTHER): Payer: BLUE CROSS/BLUE SHIELD | Admitting: Family Medicine

## 2017-05-17 ENCOUNTER — Encounter: Payer: Self-pay | Admitting: Family Medicine

## 2017-05-17 VITALS — BP 110/60 | HR 85 | Temp 98.0°F | Ht 65.0 in | Wt 171.4 lb

## 2017-05-17 DIAGNOSIS — M2550 Pain in unspecified joint: Secondary | ICD-10-CM

## 2017-05-17 DIAGNOSIS — G5603 Carpal tunnel syndrome, bilateral upper limbs: Secondary | ICD-10-CM | POA: Diagnosis not present

## 2017-05-17 DIAGNOSIS — M654 Radial styloid tenosynovitis [de Quervain]: Secondary | ICD-10-CM | POA: Diagnosis not present

## 2017-05-17 DIAGNOSIS — I00 Rheumatic fever without heart involvement: Secondary | ICD-10-CM

## 2017-05-17 DIAGNOSIS — M1380 Other specified arthritis, unspecified site: Secondary | ICD-10-CM

## 2017-05-17 MED ORDER — MELOXICAM 7.5 MG PO TABS: 7.5000 mg | ORAL_TABLET | Freq: Every day | ORAL | 0 refills | Status: DC

## 2017-05-17 NOTE — Progress Notes (Signed)
Subjective:  By signing my name below, I, Moises Blood, attest that this documentation has been prepared under the direction and in the presence of Merri Ray, MD. Electronically Signed: Moises Blood, Gratis. 05/17/2017 , 6:56 PM .  Patient was seen in Room 3 .   Patient ID: Janice Lyons, female    DOB: November 03, 1975, 42 y.o.   MRN: [id number] Chief Complaint  Patient presents with  . Arm Pain    outter elbow, wrist, and also fingers. Both arms and hand. Right side is worse. Drop things often   HPI Janice Lyons is a 42 y.o. female  Patient complains of bilateral arm pain in multiple areas and multiple joints.   Wrists Patient states her upper extremity pains started in her wrists about 5 years ago on both sides, but moreso on the right compared to left. She notes she usually sleeps on her sides and her hand tends to bend back. She wore OTC wrist brace occasionally with some flares.   Elbow pain She then flared her wrist pains and elbow pains also flared after unloading 3 dump-trucks loads of mulch about 2 years ago. She's noticed more outer elbow pain but sometimes also into the inside of her elbows as well.   Hand + finger pain She reports hand pain, primarily locate pain in her DIP joints of her fingers in the past few months. She informs worse pains on days where she's typing more for work. She's been taking ibuprofen as needed, and not everyday. When she does type more, she has flare of wrist and DIP joint pains. She denies swelling of joints or any other areas of her body.   When she lifts or picks up heavier objects, for example 4 inch binders at work, she has difficulty keeping her grip due to pain. 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 She mentions more pain this morning with needle-like sensation in her palms, when opening her front door. She denies any  recent injury. She does not remember any specific NSAID allergy; has taken meloxicam in the past without difficulty to her knowledge.   She is right hand dominant. 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   Patient Active Problem List   Diagnosis Date Noted  . Abdominal distension   . GERD (gastroesophageal reflux disease) 11/02/2011  . Abdominal bloating 11/02/2011  . Interstitial cystitis 11/02/2011  . H/O: hysterectomy 11/02/2011  . Hx of endometriosis 11/02/2011   Past Medical History:  Diagnosis Date  . Abdominal distension   . Abdominal pain   . Allergy   . Bruises easily   . Chest pain   . Chills   . Chronic cholecystitis 03/2012  . Complication of anesthesia    severe facial itching  . Constipation   . Endometriosis   . Gastritis   . Gastritis 03/2012  . Generalized headaches   . GERD (gastroesophageal reflux disease)   . Hyperplastic colon polyp   . Interstitial cystitis   . Microscopic colitis   . Nasal congestion   . Nausea   . Serrated adenoma of colon   . TMJ (dislocation of temporomandibular joint)   . Weakness   . Yeast infection    Past Surgical History:  Procedure Laterality Date  . ABDOMINAL HYSTERECTOMY    . BILATERAL TEMPOROMANDIBULAR JOINT ARTHROPLASTY    . BREAST SURGERY    .  DIAGNOSTIC LAPAROSCOPY    . LAPAROSCOPIC LYSIS OF ADHESIONS  12/13/2011   Procedure: LAPAROSCOPIC LYSIS OF ADHESIONS;  Surgeon: Allena Katz, MD;  Location: [facility];  Service: Gynecology;  Laterality: N/A;  . LAPAROSCOPY  12/13/2011   Procedure: LAPAROSCOPY OPERATIVE;  Surgeon: Allena Katz, MD;  Location: [facility];  Service: Gynecology;  Laterality: N/A;  . LAPAROTOMY    . OVARIAN CYST REMOVAL  12/13/2011   Procedure: OVARIAN CYSTECTOMY;  Surgeon: Allena Katz, MD;  Location: [facility];  Service: Gynecology;  Laterality: Right;  Marland Kitchen VAGINAL DELIVERY  1998  . WISDOM TOOTH EXTRACTION     Allergies  Allergen Reactions  . Ketorolac Itching   . Oxycodone Itching    Facial itching  . Lac Bovis Nausea Only  . Tape Rash    Glue used during surgery   . Harlin Heys D-S] Itching, Nausea Only and Palpitations   Prior to Admission medications   Medication Sig Start Date End Date Taking? Authorizing Provider  cetirizine (ZYRTEC) 10 MG tablet Take 10 mg by mouth daily.    [provider]  cyclobenzaprine (FLEXERIL) 5 MG tablet Take 1 tablet (5 mg total) by mouth 3 (three) times daily as needed for muscle spasms. 07/28/15   Darlyne Russian, MD  dicyclomine (BENTYL) 10 MG capsule Take 1 capsule (10 mg total) by mouth 4 (four) times daily -  before meals and at bedtime. Patient not taking: Reported on 07/28/2015 12/15/12   Pyrtle, Lajuan Lines, MD  Magnesium 400 MG CAPS Take by mouth.    [provider]  mesalamine (CANASA) 1000 MG suppository Place 1 suppository (1,000 mg total) rectally at bedtime. Patient not taking: Reported on 07/28/2015 01/01/13   Pyrtle, Lajuan Lines, MD  Multiple Vitamin (MULTI VITAMIN DAILY PO) Take by mouth.    [provider]  omeprazole (PRILOSEC) 20 MG capsule Take 20 mg by mouth daily.    [provider]  UNABLE TO FIND Vitamin D    [provider]   Social History   Socioeconomic History  . Marital status: Married    Spouse name: Not on file  . Number of children: 1  . Years of education: Not on file  . Highest education level: Not on file  Occupational History    Employer: [employer]  . Financial resource strain: Not on file  . Food insecurity:    Worry: Not on file    Inability: Not on file  . Transportation needs:    Medical: Not on file    Non-medical: Not on file  Tobacco Use  . Smoking status: Current Every Day Smoker    Last attempt to quit: 11/01/1993    Years since quitting: 23.5  . Smokeless tobacco: Never Used  Substance and Sexual Activity  . Alcohol use: Yes    Alcohol/week: 0.0 oz    Comment: rarely  . Drug use: No  . Sexual  activity: Not on file  Lifestyle  . Physical activity:    Days per week: Not on file    Minutes per session: Not on file  . Stress: Not on file  Relationships  . Social connections:    Talks on phone: Not on file    Gets together: Not on file    Attends religious service: Not on file    Active member of club or organization: Not on file    Attends meetings of clubs or organizations: Not on  file    Relationship status: Not on file  . Intimate partner violence:    Fear of current or ex partner: Not on file    Emotionally abused: Not on file    Physically abused: Not on file    Forced sexual activity: Not on file  Other Topics Concern  . Not on file  Social History Narrative  . Not on file   Review of Systems  Constitutional: Negative for chills, fatigue, fever and unexpected weight change.  Respiratory: Negative for cough.   Gastrointestinal: Negative for constipation, diarrhea, nausea and vomiting.  Musculoskeletal: Positive for arthralgias (upper extremities) and myalgias (upper extremities). Negative for joint swelling.  Skin: Negative for rash and wound.  Neurological: Negative for dizziness, weakness, numbness and headaches.       Objective:   Physical Exam  Constitutional: She is oriented to person, place, and time. She appears well-developed and well-nourished. No distress.  HENT:  Head: Normocephalic and atraumatic.  Eyes: Pupils are equal, round, and reactive to light. EOM are normal.  Neck: Neck supple.  Cardiovascular: Normal rate.  Pulmonary/Chest: Effort normal. No respiratory distress.  Musculoskeletal: Normal range of motion.  Neck: pain-free full ROM Shoulders: pain-free full ROM bilaterally Elbows: full ROM bilaterally, no focal bony tenderness bilaterally Forearms: non tender Right wrist: radial deviation with volar discomfort; slight tenderness over the APL/EPB tendons, positive finkelstein on the right, slightly positive tinen's on the right only but  positive phalen bilaterally Left wrist: no bony tenderness Hands/fingers: tender along DIP's of 2nd-5th fingers bilaterally, PIP's non tender, MCP's non tender, hands non tender, grip strength equal and intact, normal finger opposition, no apparent atrophy of the muscles in the hands   Neurological: She is alert and oriented to person, place, and time.  Skin: Skin is warm and dry.  Psychiatric: She has a normal mood and affect. Her behavior is normal.  Nursing note and vitals reviewed.   Vitals:   05/17/17 1743  BP: 110/60  Pulse: 85  Temp: 98 F (36.7 C)  TempSrc: Oral  SpO2: 100%  Weight: 171 lb 6.4 oz (77.7 kg)  Height: 5\' 5"  (1.651 m)       Assessment & Plan:    Jenina A Blasdel is a 42 y.o. female Migratory polyarthritis - Plan: Sedimentation Rate, C-reactive protein, ANA,IFA RA Diag Pnl w/rflx Tit/Patn, Uric Acid Polyarthralgia - Plan: Sedimentation Rate, C-reactive protein, ANA,IFA RA Diag Pnl w/rflx Tit/Patn, Uric Acid, meloxicam (MOBIC) 7.5 MG tablet  -Multiple arthralgias, various locations and appears to migrate based on history.  Inflammatory arthritis possible, will check screening labs as above and follow-up to discuss further work-up or possible rheumatology eval.  Bilateral carpal tunnel syndrome De Quervain's tenosynovitis, right  -Some findings of both carpal tunnel syndrome and de Quervain's, right great and left.  Initially we will plan for thumb spica splint for the right, can continue over-the-counter left wrist brace.  Discussed change in typing amounts if possible.  Meds ordered this encounter  Medications  . meloxicam (MOBIC) 7.5 MG tablet    Sig: Take 1 tablet (7.5 mg total) by mouth daily.    Dispense:  30 tablet    Refill:  0   Patient Instructions    Based on exam today you do have some findings of de Quervain's tenosynovitis as well as possible mild carpal tunnel symptoms.  Right side appears to be worse.  Would recommend initial bracing of  the right wrist and thumb and see information below on carpal  tunnel as well as de Quervain's tenosynovitis.  Decrease keyboarding, texting on phone, and other repetitive use of fingers and thumb as much as possible for now.  For other joint pains, I will check some inflammation tests for other causes of possible arthritis, but okay to try meloxicam 1 pill/day for the next 2 weeks.  If any itching, or new side effects of the medication, stop it right away.  Please follow-up in 2 weeks to decide next step, specifically whether or not a referral to specialist such as rheumatology or hand/orthopedics will be needed.  Please return sooner if worse.  Thank you for coming in today.    Carpal Tunnel Syndrome Carpal tunnel syndrome is a condition that causes pain in your hand and arm. The carpal tunnel is a narrow area located on the palm side of your wrist. Repeated wrist motion or certain diseases may cause swelling within the tunnel. This swelling pinches the main nerve in the wrist (median nerve). What are the causes? This condition may be caused by:  Repeated wrist motions.  Wrist injuries.  Arthritis.  A cyst or tumor in the carpal tunnel.  Fluid buildup during pregnancy.  Sometimes the cause of this condition is not known. What increases the risk? This condition is more likely to develop in:  People who have jobs that cause them to repeatedly move their wrists in the same motion, such as Art gallery manager.  Women.  People with certain conditions, such as: ? Diabetes. ? Obesity. ? An underactive thyroid (hypothyroidism). ? Kidney failure.  What are the signs or symptoms? Symptoms of this condition include:  A tingling feeling in your fingers, especially in your thumb, index, and middle fingers.  Tingling or numbness in your hand.  An aching feeling in your entire arm, especially when your wrist and elbow are bent for long periods of time.  Wrist pain that goes up your arm  to your shoulder.  Pain that goes down into your palm or fingers.  A weak feeling in your hands. You may have trouble grabbing and holding items.  Your symptoms may feel worse during the night. How is this diagnosed? This condition is diagnosed with a medical history and physical exam. You may also have tests, including:  An electromyogram (EMG). This test measures electrical signals sent by your nerves into the muscles.  X-rays.  How is this treated? Treatment for this condition includes:  Lifestyle changes. It is important to stop doing or modify the activity that caused your condition.  Physical or occupational therapy.  Medicines for pain and inflammation. This may include medicine that is injected into your wrist.  A wrist splint.  Surgery.  Follow these instructions at home: If you have a splint:  Wear it as told by your health care provider. Remove it only as told by your health care provider.  Loosen the splint if your fingers become numb and tingle, or if they turn cold and blue.  Keep the splint clean and dry. General instructions  Take over-the-counter and prescription medicines only as told by your health care provider.  Rest your wrist from any activity that may be causing your pain. If your condition is work related, talk to your employer about changes that can be made, such as getting a wrist pad to use while typing.  If directed, apply ice to the painful area: ? Put ice in a plastic bag. ? Place a towel between your skin and the bag. ? Leave the  ice on for 20 minutes, 2-3 times per day.  Keep all follow-up visits as told by your health care provider. This is important.  Do any exercises as told by your health care provider, physical therapist, or occupational therapist. Contact a health care provider if:  You have new symptoms.  Your pain is not controlled with medicines.  Your symptoms get worse. This information is not intended to replace  advice given to you by your health care provider. Make sure you discuss any questions you have with your health care provider. Document Released: 01/09/2000 Document Revised: 05/22/2015 Document Reviewed: 05/29/2014 Elsevier Interactive Patient Education  2018 [employer] Tenosynovitis Tendons attach muscles to bones. They also help with joint movements. When tendons become irritated or swollen, it is called tendinitis. The extensor pollicis brevis (EPB) tendon connects the EPB muscle to a bone that is near the base of the thumb. The EPB muscle helps to straighten and extend the thumb. De Quervain tenosynovitis is a condition in which the EPB tendon lining (sheath) becomes irritated, thickened, and swollen. This condition is sometimes called stenosing tenosynovitis. This condition causes pain on the thumb side of the back of the wrist. What are the causes? Causes of this condition include:  Activities that repeatedly cause your thumb and wrist to extend.  A sudden increase in activity or change in activity that affects your wrist.  What increases the risk? This condition is more likely to develop in:  Females.  People who have diabetes.  Women who have recently given birth.  People who are over 52 years of age.  People who do activities that involve repeated hand and wrist motions, such as tennis, racquetball, volleyball, gardening, and taking care of children.  People who do heavy labor.  People who have poor wrist strength and flexibility.  People who do not warm up properly before activities.  What are the signs or symptoms? Symptoms of this condition include:  Pain or tenderness over the thumb side of the back of the wrist when your thumb and wrist are not moving.  Pain that gets worse when you straighten your thumb or extend your thumb or wrist.  Pain when the injured area is touched.  Locking or catching of the thumb joint while you bend and straighten  your thumb.  Decreased thumb motion due to pain.  Swelling over the affected area.  How is this diagnosed? This condition is diagnosed with a medical history and physical exam. Your health care provider will ask for details about your injury and ask about your symptoms. How is this treated? Treatment may include the use of icing and medicines to reduce pain and swelling. You may also be advised to wear a splint or brace to limit your thumb and wrist motion. In less severe cases, treatment may also include working with a physical therapist to strengthen your wrist and calm the irritation around your EPB tendon sheath. In severe cases, surgery may be needed. Follow these instructions at home: If you have a splint or brace:  Wear it as told by your health care provider. Remove it only as told by your health care provider.  Loosen the splint or brace if your fingers become numb and tingle, or if they turn cold and blue.  Keep the splint or brace clean and dry. Managing pain, stiffness, and swelling  If directed, apply ice to the injured area. ? Put ice in a plastic bag. ? Place a towel between your skin  and the bag. ? Leave the ice on for 20 minutes, 2-3 times per day.  Move your fingers often to avoid stiffness and to lessen swelling.  Raise (elevate) the injured area above the level of your heart while you are sitting or lying down. General instructions  Return to your normal activities as told by your health care provider. Ask your health care provider what activities are safe for you.  Take over-the-counter and prescription medicines only as told by your health care provider.  Keep all follow-up visits as told by your health care provider. This is important.  Do not drive or operate heavy machinery while taking prescription pain medicine. Contact a health care provider if:  Your pain, tenderness, or swelling gets worse, even if you have had treatment.  You have numbness or  tingling in your wrist, hand, or fingers on the injured side. This information is not intended to replace advice given to you by your health care provider. Make sure you discuss any questions you have with your health care provider. Document Released: 01/11/2005 Document Revised: 06/19/2015 Document Reviewed: 03/19/2014 Elsevier Interactive Patient Education  2018 [employer].   IF you received an x-ray today, you will receive an invoice from [facility]. Please contact [facility] at [phone] with questions or concerns regarding your invoice.   IF you received labwork today, you will receive an invoice from [facility]. Please contact [facility] at [phone] with questions or concerns regarding your invoice.   Our billing staff will not be able to assist you with questions regarding bills from these companies.  You will be contacted with the lab results as soon as they are available. The fastest way to get your results is to activate your My Chart account. Instructions are located on the last page of this paperwork. If you have not heard from [place] regarding the results in 2 weeks, please contact this office.       I personally performed the services described in this documentation, which was scribed in my presence. The recorded information has been reviewed and considered for accuracy and completeness, addended by me as needed, and agree with information above.  Signed,   Merri Ray, MD [facility].  05/19/17 10:00 PM

## 2017-05-17 NOTE — Telephone Encounter (Signed)
Patient will be returning to clinic for right thumb spica splint.

## 2017-05-17 NOTE — Patient Instructions (Addendum)
Based on exam today you do have some findings of de Quervain's tenosynovitis as well as possible mild carpal tunnel symptoms.  Right side appears to be worse.  Would recommend initial bracing of the right wrist and thumb and see information below on carpal tunnel as well as de Quervain's tenosynovitis.  Decrease keyboarding, texting on phone, and other repetitive use of fingers and thumb as much as possible for now.  For other joint pains, I will check some inflammation tests for other causes of possible arthritis, but okay to try meloxicam 1 pill/day for the next 2 weeks.  If any itching, or new side effects of the medication, stop it right away.  Please follow-up in 2 weeks to decide next step, specifically whether or not a referral to specialist such as rheumatology or hand/orthopedics will be needed.  Please return sooner if worse.  Thank you for coming in today.    Carpal Tunnel Syndrome Carpal tunnel syndrome is a condition that causes pain in your hand and arm. The carpal tunnel is a narrow area located on the palm side of your wrist. Repeated wrist motion or certain diseases may cause swelling within the tunnel. This swelling pinches the main nerve in the wrist (median nerve). What are the causes? This condition may be caused by:  Repeated wrist motions.  Wrist injuries.  Arthritis.  A cyst or tumor in the carpal tunnel.  Fluid buildup during pregnancy.  Sometimes the cause of this condition is not known. What increases the risk? This condition is more likely to develop in:  People who have jobs that cause them to repeatedly move their wrists in the same motion, such as Art gallery manager.  Women.  People with certain conditions, such as: ? Diabetes. ? Obesity. ? An underactive thyroid (hypothyroidism). ? Kidney failure.  What are the signs or symptoms? Symptoms of this condition include:  A tingling feeling in your fingers, especially in your thumb, index, and  middle fingers.  Tingling or numbness in your hand.  An aching feeling in your entire arm, especially when your wrist and elbow are bent for long periods of time.  Wrist pain that goes up your arm to your shoulder.  Pain that goes down into your palm or fingers.  A weak feeling in your hands. You may have trouble grabbing and holding items.  Your symptoms may feel worse during the night. How is this diagnosed? This condition is diagnosed with a medical history and physical exam. You may also have tests, including:  An electromyogram (EMG). This test measures electrical signals sent by your nerves into the muscles.  X-rays.  How is this treated? Treatment for this condition includes:  Lifestyle changes. It is important to stop doing or modify the activity that caused your condition.  Physical or occupational therapy.  Medicines for pain and inflammation. This may include medicine that is injected into your wrist.  A wrist splint.  Surgery.  Follow these instructions at home: If you have a splint:  Wear it as told by your health care provider. Remove it only as told by your health care provider.  Loosen the splint if your fingers become numb and tingle, or if they turn cold and blue.  Keep the splint clean and dry. General instructions  Take over-the-counter and prescription medicines only as told by your health care provider.  Rest your wrist from any activity that may be causing your pain. If your condition is work related, talk to your employer  about changes that can be made, such as getting a wrist pad to use while typing.  If directed, apply ice to the painful area: ? Put ice in a plastic bag. ? Place a towel between your skin and the bag. ? Leave the ice on for 20 minutes, 2-3 times per day.  Keep all follow-up visits as told by your health care provider. This is important.  Do any exercises as told by your health care provider, physical therapist, or  occupational therapist. Contact a health care provider if:  You have new symptoms.  Your pain is not controlled with medicines.  Your symptoms get worse. This information is not intended to replace advice given to you by your health care provider. Make sure you discuss any questions you have with your health care provider. Document Released: 01/09/2000 Document Revised: 05/22/2015 Document Reviewed: 05/29/2014 Elsevier Interactive Patient Education  2018 [place] Tenosynovitis Tendons attach muscles to bones. They also help with joint movements. When tendons become irritated or swollen, it is called tendinitis. The extensor pollicis brevis (EPB) tendon connects the EPB muscle to a bone that is near the base of the thumb. The EPB muscle helps to straighten and extend the thumb. De Quervain tenosynovitis is a condition in which the EPB tendon lining (sheath) becomes irritated, thickened, and swollen. This condition is sometimes called stenosing tenosynovitis. This condition causes pain on the thumb side of the back of the wrist. What are the causes? Causes of this condition include:  Activities that repeatedly cause your thumb and wrist to extend.  A sudden increase in activity or change in activity that affects your wrist.  What increases the risk? This condition is more likely to develop in:  Females.  People who have diabetes.  Women who have recently given birth.  People who are over 50 years of age.  People who do activities that involve repeated hand and wrist motions, such as tennis, racquetball, volleyball, gardening, and taking care of children.  People who do heavy labor.  People who have poor wrist strength and flexibility.  People who do not warm up properly before activities.  What are the signs or symptoms? Symptoms of this condition include:  Pain or tenderness over the thumb side of the back of the wrist when your thumb and wrist are not  moving.  Pain that gets worse when you straighten your thumb or extend your thumb or wrist.  Pain when the injured area is touched.  Locking or catching of the thumb joint while you bend and straighten your thumb.  Decreased thumb motion due to pain.  Swelling over the affected area.  How is this diagnosed? This condition is diagnosed with a medical history and physical exam. Your health care provider will ask for details about your injury and ask about your symptoms. How is this treated? Treatment may include the use of icing and medicines to reduce pain and swelling. You may also be advised to wear a splint or brace to limit your thumb and wrist motion. In less severe cases, treatment may also include working with a physical therapist to strengthen your wrist and calm the irritation around your EPB tendon sheath. In severe cases, surgery may be needed. Follow these instructions at home: If you have a splint or brace:  Wear it as told by your health care provider. Remove it only as told by your health care provider.  Loosen the splint or brace if your fingers become numb and  tingle, or if they turn cold and blue.  Keep the splint or brace clean and dry. Managing pain, stiffness, and swelling  If directed, apply ice to the injured area. ? Put ice in a plastic bag. ? Place a towel between your skin and the bag. ? Leave the ice on for 20 minutes, 2-3 times per day.  Move your fingers often to avoid stiffness and to lessen swelling.  Raise (elevate) the injured area above the level of your heart while you are sitting or lying down. General instructions  Return to your normal activities as told by your health care provider. Ask your health care provider what activities are safe for you.  Take over-the-counter and prescription medicines only as told by your health care provider.  Keep all follow-up visits as told by your health care provider. This is important.  Do not drive or  operate heavy machinery while taking prescription pain medicine. Contact a health care provider if:  Your pain, tenderness, or swelling gets worse, even if you have had treatment.  You have numbness or tingling in your wrist, hand, or fingers on the injured side. This information is not intended to replace advice given to you by your health care provider. Make sure you discuss any questions you have with your health care provider. Document Released: 01/11/2005 Document Revised: 06/19/2015 Document Reviewed: 03/19/2014 Elsevier Interactive Patient Education  2018 [employer].   IF you received an x-ray today, you will receive an invoice from [facility]. Please contact [facility] at [phone] with questions or concerns regarding your invoice.   IF you received labwork today, you will receive an invoice from [place]. Please contact [facility] at [phone] with questions or concerns regarding your invoice.   Our billing staff will not be able to assist you with questions regarding bills from these companies.  You will be contacted with the lab results as soon as they are available. The fastest way to get your results is to activate your My Chart account. Instructions are located on the last page of this paperwork. If you have not heard from [place] regarding the results in 2 weeks, please contact this office.

## 2017-05-18 DIAGNOSIS — M2550 Pain in unspecified joint: Secondary | ICD-10-CM | POA: Diagnosis not present

## 2017-05-18 DIAGNOSIS — I00 Rheumatic fever without heart involvement: Secondary | ICD-10-CM | POA: Diagnosis not present

## 2017-05-19 ENCOUNTER — Encounter: Payer: Self-pay | Admitting: Family Medicine

## 2017-05-20 LAB — ANA,IFA RA DIAG PNL W/RFLX TIT/PATN
ANA TITER 1: NEGATIVE
Cyclic Citrullin Peptide Ab: 7 units (ref 0–19)

## 2017-05-20 LAB — C-REACTIVE PROTEIN: CRP: 0.4 mg/L (ref 0.0–4.9)

## 2017-05-20 LAB — URIC ACID: Uric Acid: 3.8 mg/dL (ref 2.5–7.1)

## 2017-05-20 LAB — SEDIMENTATION RATE: SED RATE: 9 mm/h (ref 0–32)

## 2017-05-24 NOTE — Telephone Encounter (Signed)
Wrist brace located. Phone call to patient, advised of office hours. Patient will come by to pick up brace.

## 2017-05-26 DIAGNOSIS — G5603 Carpal tunnel syndrome, bilateral upper limbs: Secondary | ICD-10-CM | POA: Diagnosis not present

## 2017-05-26 DIAGNOSIS — M654 Radial styloid tenosynovitis [de Quervain]: Secondary | ICD-10-CM | POA: Diagnosis not present

## 2017-05-27 NOTE — Telephone Encounter (Signed)
Patient has come by and picked up her arm brace yesterday

## 2017-05-31 ENCOUNTER — Ambulatory Visit (INDEPENDENT_AMBULATORY_CARE_PROVIDER_SITE_OTHER): Payer: BLUE CROSS/BLUE SHIELD | Admitting: Family Medicine

## 2017-05-31 ENCOUNTER — Encounter: Payer: Self-pay | Admitting: Family Medicine

## 2017-05-31 ENCOUNTER — Other Ambulatory Visit: Payer: Self-pay

## 2017-05-31 VITALS — BP 108/70 | HR 81 | Temp 98.2°F | Ht 65.0 in | Wt 170.6 lb

## 2017-05-31 DIAGNOSIS — M25521 Pain in right elbow: Secondary | ICD-10-CM

## 2017-05-31 DIAGNOSIS — R200 Anesthesia of skin: Secondary | ICD-10-CM

## 2017-05-31 DIAGNOSIS — M654 Radial styloid tenosynovitis [de Quervain]: Secondary | ICD-10-CM

## 2017-05-31 DIAGNOSIS — M79641 Pain in right hand: Secondary | ICD-10-CM

## 2017-05-31 DIAGNOSIS — M79642 Pain in left hand: Secondary | ICD-10-CM | POA: Diagnosis not present

## 2017-05-31 NOTE — Progress Notes (Signed)
Subjective:  By signing my name below, I, Moises Blood, attest that this documentation has been prepared under the direction and in the presence of Merri Ray, MD. Electronically Signed: Moises Blood, [place]. 05/31/2017 , 5:52 PM .  Patient was seen in Room 3 .   Patient ID: Janice Lyons, female    DOB: 1976/01/17, 42 y.o.   MRN: [id number] Chief Complaint  Patient presents with  . Both arms Pain    both arms hands and wrist  X2 weeks follow up (hard to work with in brace)   HPI Janice Lyons is a 42 y.o. female  Patient is here for follow up for bilateral arm and wrist pain. She was seen for multiple areas of involvement, see details of last visit. Thought component of De Quervain's and carpal tunnel, recommended changing amount of typing if possible. She was started on Mobic. Lab work for inflammatory arthritis was reassuring.   Results for orders placed or performed in visit on 05/17/17  Sedimentation Rate  Result Value Ref Range   Sed Rate 9 0 - 32 mm/hr  C-reactive protein  Result Value Ref Range   CRP 0.4 0.0 - 4.9 mg/L  ANA,IFA RA Diag Pnl w/rflx Tit/Patn  Result Value Ref Range   ANA Titer 1 Negative    Rhuematoid fact SerPl-aCnc <10.0 0.0 - 63.7 IU/mL   Cyclic Citrullin Peptide Ab 7 0 - 19 units  Uric Acid  Result Value Ref Range   Uric Acid 3.8 2.5 - 7.1 mg/dL    Right Finger pain She reports having some fingers with shooting pain, but not constant, just with certain movements and touching certain objects. She notes her finger pain has improved on meloxicam QD. She denies swelling in joints.   Right wrist pain She's been wearing a wrist brace on the right wrist. She went to the beach for about 4 days, and had improvement of pain after not working. However, after returning to work within 2 hours of typing, she felt the pain return. She hasn't been able to use her mouse with the wrist brace so she's been wearing wrist brace at night. She notes still having  pain into the thumb area still.   Right elbow pain She denies any change in her right elbow pain, which has been present for a few months now and just gradually gotten worse. She describes burning sensation over the under surface of her right elbow. She has some elbow pain in the middle elbow if bent. She denies pain over the top of her right elbow. She also has some numbness in the elbow.   Left arm numbness She noticed left arm numbness, from her left shoulder to her left hand, when driving to the beach about 10 days ago. She states the left arm numbness lasted for about 3 hours, and it resolved on its own. She was still able to use her left arm, but describes just feeling numb. She denies neck pain, neck stiffness, chest pain, or trouble breathing.   She's felt the same left arm numbness 2 more times:  Last Friday (05/27/17), numb for 6 hours, like her arm "fell asleep" when she was on the way driving to work. She was still able to use her left arm. She denies neck pain, stiffness or soreness at that time. She denies headaches.   Last Sunday (05/29/17), numb for about an hour after holding a baby.   Patient Active Problem List   Diagnosis Date Noted  .  Abdominal distension   . GERD (gastroesophageal reflux disease) 11/02/2011  . Abdominal bloating 11/02/2011  . Interstitial cystitis 11/02/2011  . H/O: hysterectomy 11/02/2011  . Hx of endometriosis 11/02/2011   Past Medical History:  Diagnosis Date  . Abdominal distension   . Abdominal pain   . Allergy   . Bruises easily   . Chest pain   . Chills   . Chronic cholecystitis 03/2012  . Complication of anesthesia    severe facial itching  . Constipation   . Endometriosis   . Gastritis   . Gastritis 03/2012  . Generalized headaches   . GERD (gastroesophageal reflux disease)   . Hyperplastic colon polyp   . Interstitial cystitis   . Microscopic colitis   . Nasal congestion   . Nausea   . Serrated adenoma of colon   . TMJ  (dislocation of temporomandibular joint)   . Weakness   . Yeast infection    Past Surgical History:  Procedure Laterality Date  . ABDOMINAL HYSTERECTOMY    . BILATERAL TEMPOROMANDIBULAR JOINT ARTHROPLASTY    . BREAST SURGERY    . DIAGNOSTIC LAPAROSCOPY    . LAPAROSCOPIC LYSIS OF ADHESIONS  12/13/2011   Procedure: LAPAROSCOPIC LYSIS OF ADHESIONS;  Surgeon: Allena Katz, MD;  Location: [facility];  Service: Gynecology;  Laterality: N/A;  . LAPAROSCOPY  12/13/2011   Procedure: LAPAROSCOPY OPERATIVE;  Surgeon: Allena Katz, MD;  Location: [facility];  Service: Gynecology;  Laterality: N/A;  . LAPAROTOMY    . OVARIAN CYST REMOVAL  12/13/2011   Procedure: OVARIAN CYSTECTOMY;  Surgeon: Allena Katz, MD;  Location: [facility];  Service: Gynecology;  Laterality: Right;  Marland Kitchen VAGINAL DELIVERY  1998  . WISDOM TOOTH EXTRACTION     Allergies  Allergen Reactions  . Ketorolac Itching  . Oxycodone Itching    Facial itching  . Lac Bovis Nausea Only  . Tape Rash    Glue used during surgery   . Harlin Heys D-S] Itching, Nausea Only and Palpitations   Prior to Admission medications   Medication Sig Start Date End Date Taking? Authorizing Provider  cetirizine (ZYRTEC) 10 MG tablet Take 10 mg by mouth daily.    [provider]  Magnesium 400 MG CAPS Take by mouth.    [provider]  meloxicam (MOBIC) 7.5 MG tablet Take 1 tablet (7.5 mg total) by mouth daily. 05/17/17   Wendie Agreste, MD  Multiple Vitamin (MULTI VITAMIN DAILY PO) Take by mouth.    [provider]  omeprazole (PRILOSEC) 20 MG capsule Take 20 mg by mouth daily.    [provider]  UNABLE TO FIND Vitamin D    [provider]   Social History   Socioeconomic History  . Marital status: Married    Spouse name: Not on file  . Number of children: 1  . Years of education: Not on file  . Highest education level: Not on file  Occupational History    Employer: [employer]  Needs  . Financial resource strain: Not on file  . Food insecurity:    Worry: Not on file    Inability: Not on file  . Transportation needs:    Medical: Not on file    Non-medical: Not on file  Tobacco Use  . Smoking status: Current Every Day Smoker    Last attempt to quit: 11/01/1993    Years since quitting: 23.5  . Smokeless tobacco: Never Used  Substance and Sexual Activity  . Alcohol use: Yes    Alcohol/week: 0.0 oz    Comment: rarely  . Drug use: No  . Sexual activity: Not on file  Lifestyle  . Physical activity:    Days per week: Not on file    Minutes per session: Not on file  . Stress: Not on file  Relationships  . Social connections:    Talks on phone: Not on file    Gets together: Not on file    Attends religious service: Not on file    Active member of club or organization: Not on file    Attends meetings of clubs or organizations: Not on file    Relationship status: Not on file  . Intimate partner violence:    Fear of current or ex partner: Not on file    Emotionally abused: Not on file    Physically abused: Not on file    Forced sexual activity: Not on file  Other Topics Concern  . Not on file  Social History Narrative  . Not on file   Review of Systems  Constitutional: Negative for chills, fatigue, fever and unexpected weight change.  Respiratory: Negative for cough.   Gastrointestinal: Negative for constipation, diarrhea, nausea and vomiting.  Musculoskeletal: Positive for arthralgias. Negative for joint swelling.  Skin: Negative for rash and wound.  Neurological: Positive for numbness. Negative for dizziness, weakness and headaches.       Objective:   Physical Exam  Constitutional: She is oriented to person, place, and time. She appears well-developed and well-nourished. No distress.  HENT:  Head: Normocephalic and atraumatic.  Eyes: Pupils are equal, round, and reactive to light. EOM are normal.  Neck: Neck supple.  Cardiovascular: Normal  rate.  Pulmonary/Chest: Effort normal. No respiratory distress.  Musculoskeletal: Normal range of motion.  Full ROM of fingers bilaterally; NVI distally to her finger tips, cap refill <2 seconds, hand veins flattened with hand elevation Wrists: pain with Wynn Maudlin testing, full ROM of right wrist; mild positive Tinels on left only, no focal joint swelling Right elbow: full ROM, lateral epicondyle non tender, minimal tenderness over medial epicondyle on initial palpation, but non tender on repeat testing; no soft tissue swelling; does have some pain along her medial malleolus, pain with pronation of her hand Pain free ROM of left elbow Pain free ROM of shoulders C-spine: full pain-free ROM, does not cause left arm symptoms  Neurological: She is alert and oriented to person, place, and time.  Skin: Skin is warm and dry.  Psychiatric: She has a normal mood and affect. Her behavior is normal.  Nursing note and vitals reviewed.   Vitals:   05/31/17 1659  BP: 108/70  Pulse: 81  Temp: 98.2 F (36.8 C)  TempSrc: Oral  SpO2: 100%  Weight: 170 lb 9.6 oz (77.4 kg)  Height: 5\' 5"  (1.651 m)        Assessment & Plan:   Janice Lyons is a 42 y.o. female Pain in both hands - Plan: Ambulatory referral to Hand Surgery  De Quervain's disease (tenosynovitis) - Plan: Ambulatory referral to Hand Surgery  Right elbow pain - Plan: Ambulatory referral to Hand Surgery  Left arm numbness - Plan: Ambulatory referral to Hand Surgery  As above she did have some improvement some relative rest from her job and meloxicam, then recurrence once she returned to work.  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  No apparent numbness on exam currently of the left arm, and right elbow symptoms are intermittent.     -Continue meloxicam at this time, refer to hand surgeon.  Continue bracing for  her right  wrist/thumb.  Stressed importance of relative rest with overuse syndromes possible.  If FMLA or temporary note for work needed, she is to let me know.   No orders of the defined types were placed in this encounter.  Patient Instructions   I'm glad to hear that the pain improved with meloxicam and rest. I will refer you to hand specialist to look into other treatments or possible injections. As we discussed, rest/decreased typing and use of mouse may be needed for these areas to improve. Continue meloxicam for now and wrist brace.   Return to the clinic or go to the nearest emergency room if any of your symptoms worsen or new symptoms occur.     IF you received an x-ray today, you will receive an invoice from [facility]. Please contact [facility] at [phone] with questions or concerns regarding your invoice.   IF you received labwork today, you will receive an invoice from [place] [facility]. Please contact [facility] at [phone] with questions or concerns regarding your invoice.   Our billing staff will not be able to assist you with questions regarding bills from these companies.  You will be contacted with the lab results as soon as they are available. The fastest way to get your results is to activate your My Chart account. Instructions are located on the last page of this paperwork. If you have not heard from [place] regarding the results in 2 weeks, please contact this office.

## 2017-05-31 NOTE — Patient Instructions (Addendum)
I'm glad to hear that the pain improved with meloxicam and rest. I will refer you to hand specialist to look into other treatments or possible injections. As we discussed, rest/decreased typing and use of mouse may be needed for these areas to improve. Continue meloxicam for now and wrist brace.   Return to the clinic or go to the nearest emergency room if any of your symptoms worsen or new symptoms occur.     IF you received an x-ray today, you will receive an invoice from [facility]. Please contact [facility] at [phone] with questions or concerns regarding your invoice.   IF you received labwork today, you will receive an invoice from [facility]. Please contact [facility] at [phone] with questions or concerns regarding your invoice.   Our billing staff will not be able to assist you with questions regarding bills from these companies.  You will be contacted with the lab results as soon as they are available. The fastest way to get your results is to activate your My Chart account. Instructions are located on the last page of this paperwork. If you have not heard from [place] regarding the results in 2 weeks, please contact this office.

## 2017-06-10 DIAGNOSIS — G5603 Carpal tunnel syndrome, bilateral upper limbs: Secondary | ICD-10-CM | POA: Diagnosis not present

## 2017-06-28 ENCOUNTER — Other Ambulatory Visit: Payer: Self-pay | Admitting: Family Medicine

## 2017-06-28 DIAGNOSIS — M2550 Pain in unspecified joint: Secondary | ICD-10-CM

## 2017-06-28 NOTE — Telephone Encounter (Signed)
Please advise on refill  Pt was last seen 05/31/2016 for both hand pain.

## 2017-08-13 DIAGNOSIS — H6983 Other specified disorders of Eustachian tube, bilateral: Secondary | ICD-10-CM | POA: Diagnosis not present

## 2017-08-13 DIAGNOSIS — J019 Acute sinusitis, unspecified: Secondary | ICD-10-CM | POA: Diagnosis not present

## 2017-08-13 DIAGNOSIS — B9689 Other specified bacterial agents as the cause of diseases classified elsewhere: Secondary | ICD-10-CM | POA: Diagnosis not present

## 2017-08-26 DIAGNOSIS — Z6828 Body mass index (BMI) 28.0-28.9, adult: Secondary | ICD-10-CM | POA: Diagnosis not present

## 2017-08-26 DIAGNOSIS — J0100 Acute maxillary sinusitis, unspecified: Secondary | ICD-10-CM | POA: Diagnosis not present

## 2017-11-10 DIAGNOSIS — Z6829 Body mass index (BMI) 29.0-29.9, adult: Secondary | ICD-10-CM | POA: Diagnosis not present

## 2017-11-10 DIAGNOSIS — Z1231 Encounter for screening mammogram for malignant neoplasm of breast: Secondary | ICD-10-CM | POA: Diagnosis not present

## 2017-11-10 DIAGNOSIS — Z01419 Encounter for gynecological examination (general) (routine) without abnormal findings: Secondary | ICD-10-CM | POA: Diagnosis not present

## 2017-11-10 DIAGNOSIS — Z13820 Encounter for screening for osteoporosis: Secondary | ICD-10-CM | POA: Diagnosis not present

## 2017-12-09 ENCOUNTER — Encounter: Payer: Self-pay | Admitting: Neurology

## 2017-12-12 ENCOUNTER — Other Ambulatory Visit: Payer: Self-pay | Admitting: *Deleted

## 2017-12-12 DIAGNOSIS — M25531 Pain in right wrist: Secondary | ICD-10-CM

## 2017-12-12 DIAGNOSIS — M79642 Pain in left hand: Principal | ICD-10-CM

## 2017-12-12 DIAGNOSIS — M79641 Pain in right hand: Secondary | ICD-10-CM

## 2017-12-12 DIAGNOSIS — M25532 Pain in left wrist: Secondary | ICD-10-CM

## 2018-01-12 ENCOUNTER — Ambulatory Visit (INDEPENDENT_AMBULATORY_CARE_PROVIDER_SITE_OTHER): Payer: BLUE CROSS/BLUE SHIELD | Admitting: Neurology

## 2018-01-12 DIAGNOSIS — M25532 Pain in left wrist: Secondary | ICD-10-CM

## 2018-01-12 DIAGNOSIS — M79641 Pain in right hand: Secondary | ICD-10-CM

## 2018-01-12 DIAGNOSIS — M25531 Pain in right wrist: Secondary | ICD-10-CM

## 2018-01-12 DIAGNOSIS — M79642 Pain in left hand: Secondary | ICD-10-CM

## 2018-01-12 NOTE — Procedures (Signed)
[facility]  [address]  [place], [place] [address] Tel: [phone] Fax:  [phone] Test Date:  01/12/2018  Patient: Janice Lyons DOB: Aug 05, 1975 Physician: Narda Amber, DO  Sex: Female Height: 5\' 5"  Ref Phys: Berle Mull, MD  ID#: [id number] Temp: 34.0C Technician:    Patient Complaints: This is a 42 year old female referred for evaluation of bilateral wrist and hand pain.  NCV & EMG Findings: Extensive electrodiagnostic testing of the right upper extremity and additional studies of the left shows: 1. Bilateral median, ulnar, and mixed palmar sensory responses are within normal limits.  2. Bilateral median and ulnar motor responses are within normal limits.   3. There is no evidence of active or chronic motor axonal loss changes affecting any of the tested muscles.  Motor unit configuration and recruitment pattern is within normal limits.   Impression: This is a normal study of the upper extremities.  In particular, there is no evidence of carpal tunnel syndrome or a cervical radiculopathy.     ___________________________ Narda Amber, DO    Nerve Conduction Studies Anti Sensory Summary Table   Site NR Peak (ms) Norm Peak (ms) P-T Amp (V) Norm P-T Amp  Left Median Anti Sensory (2nd Digit)  34C  Wrist    2.9 <3.4 48.5 >20  Right Median Anti Sensory (2nd Digit)  34C  Wrist    2.8 <3.4 42.4 >20  Left Ulnar Anti Sensory (5th Digit)  34C  Wrist    3.1 <3.1 45.4 >12  Right Ulnar Anti Sensory (5th Digit)  34C  Wrist    2.7 <3.1 47.7 >12   Motor Summary Table   Site NR Onset (ms) Norm Onset (ms) O-P Amp (mV) Norm O-P Amp Site1 Site2 Delta-0 (ms) Dist (cm) Vel (m/s) Norm Vel (m/s)  Left Median Motor (Abd Poll Brev)  36C  Wrist    2.7 <3.9 10.6 >6 Elbow Wrist 5.0 29.0 58 >50  Elbow    7.7  10.0         Right Median Motor (Abd Poll Brev)  34C  Wrist    2.6 <3.9 10.9 >6 Elbow Wrist 4.5 27.0 60 >50  Elbow    7.1  10.2         Left  Ulnar Motor (Abd Dig Minimi)  34C  Wrist    2.7 <3.1 9.6 >7 B Elbow Wrist 3.6 22.0 61 >50  B Elbow    6.3  9.4  A Elbow B Elbow 1.6 10.0 62 >50  A Elbow    7.9  9.2         Right Ulnar Motor (Abd Dig Minimi)  34C  Wrist    2.4 <3.1 9.7 >7 B Elbow Wrist 3.6 22.0 61 >50  B Elbow    6.0  9.2  A Elbow B Elbow 1.8 10.0 56 >50  A Elbow    7.8  9.0          Comparison Summary Table   Site NR Peak (ms) Norm Peak (ms) P-T Amp (V) Site1 Site2 Delta-P (ms) Norm Delta (ms)  Left Median/Ulnar Palm Comparison (Wrist - 8cm)  34C  Median Palm    1.4 <2.2 61.9 Median Palm Ulnar Palm 0.3   Ulnar Palm    1.7 <2.2 24.5      Right Median/Ulnar Palm Comparison (Wrist - 8cm)  34C  Median Palm    1.7 <2.2 42.9 Median Palm Ulnar Palm 0.2   Ulnar [employer]  1.5 <2.2 24.7       EMG   Side Muscle Ins Act Fibs Psw Fasc Number Recrt Dur Dur. Amp Amp. Poly Poly. Comment  Right 1stDorInt Nml Nml Nml Nml Nml Nml Nml Nml Nml Nml Nml Nml N/A  Right PronatorTeres Nml Nml Nml Nml Nml Nml Nml Nml Nml Nml Nml Nml N/A  Right Biceps Nml Nml Nml Nml Nml Nml Nml Nml Nml Nml Nml Nml N/A  Right Triceps Nml Nml Nml Nml Nml Nml Nml Nml Nml Nml Nml Nml N/A  Right Deltoid Nml Nml Nml Nml Nml Nml Nml Nml Nml Nml Nml Nml N/A  Left 1stDorInt Nml Nml Nml Nml Nml Nml Nml Nml Nml Nml Nml Nml N/A  Left PronatorTeres Nml Nml Nml Nml Nml Nml Nml Nml Nml Nml Nml Nml N/A  Left Biceps Nml Nml Nml Nml Nml Nml Nml Nml Nml Nml Nml Nml N/A  Left Triceps Nml Nml Nml Nml Nml Nml Nml Nml Nml Nml Nml Nml N/A  Left Deltoid Nml Nml Nml Nml Nml Nml Nml Nml Nml Nml Nml Nml N/A      Waveforms:

## 2018-02-12 ENCOUNTER — Encounter: Payer: Self-pay | Admitting: Emergency Medicine

## 2018-02-12 ENCOUNTER — Emergency Department (INDEPENDENT_AMBULATORY_CARE_PROVIDER_SITE_OTHER)
Admission: EM | Admit: 2018-02-12 | Discharge: 2018-02-12 | Disposition: A | Discharge status: Home / Self Care | Payer: BLUE CROSS/BLUE SHIELD | Source: Home / Self Care

## 2018-02-12 ENCOUNTER — Other Ambulatory Visit: Payer: Self-pay

## 2018-02-12 DIAGNOSIS — N390 Urinary tract infection, site not specified: Secondary | ICD-10-CM

## 2018-02-12 LAB — POCT URINALYSIS DIP (MANUAL ENTRY)
Bilirubin, UA: NEGATIVE
Glucose, UA: NEGATIVE mg/dL
Ketones, POC UA: NEGATIVE mg/dL
Nitrite, UA: POSITIVE — AB
PROTEIN UA: NEGATIVE mg/dL
Spec Grav, UA: 1.01 (ref 1.010–1.025)
Urobilinogen, UA: 0.2 E.U./dL
pH, UA: 5.5 (ref 5.0–8.0)

## 2018-02-12 MED ORDER — NITROFURANTOIN MONOHYD MACRO 100 MG PO CAPS: 100.0000 mg | ORAL_CAPSULE | Freq: Two times a day (BID) | ORAL | 0 refills | Status: AC

## 2018-02-12 NOTE — ED Provider Notes (Signed)
[facility]    CSN: [id number] Arrival date & time: 02/12/18  1336     History   Chief Complaint Chief Complaint  Patient presents with  . Dysuria    HPI Janice Lyons is a 43 y.o. female.   HPI  This is a 43 y.o. female who presents today with UTI symptoms of dysuria x 2 days. Pain is mostly originating from her urethra with the sensation of pinching.  She denies chills, fever, or CVA tenderness. She doesn't routinely have UTI. She denies chance of pregnancy. Has tried over-the-counter measures without improvement.    Past Medical History:  Diagnosis Date  . Abdominal distension   . Abdominal pain   . Allergy   . Bruises easily   . Chest pain   . Chills   . Chronic cholecystitis 03/2012  . Complication of anesthesia    severe facial itching  . Constipation   . Endometriosis   . Gastritis   . Gastritis 03/2012  . Generalized headaches   . GERD (gastroesophageal reflux disease)   . Hyperplastic colon polyp   . Interstitial cystitis   . Microscopic colitis   . Nasal congestion   . Nausea   . Serrated adenoma of colon   . TMJ (dislocation of temporomandibular joint)   . Weakness   . Yeast infection     Patient Active Problem List   Diagnosis Date Noted  . Abdominal distension   . GERD (gastroesophageal reflux disease) 11/02/2011  . Abdominal bloating 11/02/2011  . Interstitial cystitis 11/02/2011  . H/O: hysterectomy 11/02/2011  . Hx of endometriosis 11/02/2011    Past Surgical History:  Procedure Laterality Date  . ABDOMINAL HYSTERECTOMY    . BILATERAL TEMPOROMANDIBULAR JOINT ARTHROPLASTY    . BREAST SURGERY    . DIAGNOSTIC LAPAROSCOPY    . LAPAROSCOPIC LYSIS OF ADHESIONS  12/13/2011   Procedure: LAPAROSCOPIC LYSIS OF ADHESIONS;  Surgeon: Allena Katz, MD;  Location: [facility];  Service: Gynecology;  Laterality: N/A;  . LAPAROSCOPY  12/13/2011   Procedure: LAPAROSCOPY OPERATIVE;  Surgeon: Allena Katz, MD;  Location: [facility];   Service: Gynecology;  Laterality: N/A;  . LAPAROTOMY    . OVARIAN CYST REMOVAL  12/13/2011   Procedure: OVARIAN CYSTECTOMY;  Surgeon: Allena Katz, MD;  Location: [facility];  Service: Gynecology;  Laterality: Right;  Marland Kitchen VAGINAL DELIVERY  1998  . WISDOM TOOTH EXTRACTION      OB History    Gravida  1   Para  1   Term  1   Preterm      AB      Living  1     SAB      TAB      Ectopic      Multiple      Live Births               Home Medications    Prior to Admission medications   Medication Sig Start Date End Date Taking? Authorizing Provider  cetirizine (ZYRTEC) 10 MG tablet Take 10 mg by mouth daily.    [provider]  Magnesium 400 MG CAPS Take by mouth.    [provider]  meloxicam (MOBIC) 7.5 MG tablet TAKE 1 TABLET(7.5 MG) BY MOUTH DAILY 06/29/17   Wendie Agreste, MD  Multiple Vitamin (MULTI VITAMIN DAILY PO) Take by mouth.    [provider]  omeprazole (PRILOSEC) 20 MG capsule Take 20 mg by  mouth daily.    [provider]  UNABLE TO FIND Vitamin D    [provider]    Family History Family History  Problem Relation Age of Onset  . Diabetes Maternal Grandfather   . Leukemia Maternal Grandfather   . Cancer Maternal Grandfather        leukemia  . Thyroid cancer Father   . Stroke Father   . Cancer Father   . Heart disease Father   . Hypertension Mother   . Cancer Paternal Grandmother        does not what type    Social History Social History   Tobacco Use  . Smoking status: Current Every Day Smoker    Last attempt to quit: 11/01/1993    Years since quitting: 24.2  . Smokeless tobacco: Never Used  Substance Use Topics  . Alcohol use: Yes    Alcohol/week: 0.0 standard drinks    Comment: rarely  . Drug use: No     Allergies   Ketorolac; Oxycodone; Lac bovis; Tape; and Uribel [uretron d-s]   Review of Systems Review of Systems Pertinent negatives listed in HPI Physical Exam Triage  Vital Signs ED Triage Vitals  Enc Vitals Group     BP      Pulse      Resp      Temp      Temp src      SpO2      Weight      Height      Head Circumference      Peak Flow      Pain Score      Pain Loc      Pain Edu?      Excl. in [place]?    No data found.  Updated Vital Signs There were no vitals taken for this visit.  Visual Acuity Right Eye Distance:   Left Eye Distance:   Bilateral Distance:    Right Eye Near:   Left Eye Near:    Bilateral Near:     Physical Exam General appearance: alert, well developed, well nourished, cooperative and in no distress Head: Normocephalic, without obvious abnormality, atraumatic Respiratory: Respirations even and unlabored, normal respiratory rate Abdomen: negative for CVA tenderness  Extremities: No gross deformities Skin: Skin color, texture, turgor normal. No rashes seen  Psych: Appropriate mood and affect. Neurologic: Mental status: Alert, oriented to person, place, and time, thought content appropriate.  UC Treatments / Results  Labs (all labs ordered are listed, but only abnormal results are displayed) Labs Reviewed - No data to display  EKG None  Radiology No results found.  Procedures Procedures (including critical care time)  Medications Ordered in UC Medications - No data to display  Initial Impression / Assessment and Plan / UC Course  I have reviewed the triage vital signs and the nursing notes.  Pertinent labs & imaging results that were available during my care of the patient were reviewed by me and considered in my medical decision making (see chart for details).   Uncomplicated UTI. Treating with Macrobid while awaiting urine culture. An After Visit Summary was printed and given to the patient/family. Precautions discussed. Red flags discussed. Questions invited and answered. They voiced understanding and agreement.  Final Clinical Impressions(s) / UC Diagnoses   Final diagnoses:  Acute UTI      Discharge Instructions     Hydrate well with 6-8 8 oz glasses of water and cranberry juice works well to flush bladder  also. Take all medication as prescribed    ED Prescriptions    Medication Sig Dispense Auth. Provider   nitrofurantoin, macrocrystal-monohydrate, (MACROBID) 100 MG capsule Take 1 capsule (100 mg total) by mouth 2 (two) times daily for 7 days. 14 capsule Scot Jun, FNP     Controlled Substance Prescriptions  Controlled Substance Registry consulted? Not Applicable   Scot Jun, [place] 02/12/18 1421

## 2018-02-12 NOTE — Discharge Instructions (Addendum)
Hydrate well with 6-8 8 oz glasses of water and cranberry juice works well to flush bladder also. Take all medication as prescribed

## 2018-02-12 NOTE — ED Triage Notes (Signed)
Patient has had some sensation of urethral pinching over past 2 days. In past this has happened and some events were diagnosed as urethritis instead of UTI.

## 2018-02-14 ENCOUNTER — Telehealth: Payer: Self-pay | Admitting: *Deleted

## 2018-02-14 LAB — URINE CULTURE
MICRO NUMBER:: 76691
SPECIMEN QUALITY: ADEQUATE

## 2018-02-14 NOTE — Telephone Encounter (Signed)
Callback: Patient reports she is improving. UCX results discussed. Encouraged to complete antibiotic.

## 2018-07-14 DIAGNOSIS — B9689 Other specified bacterial agents as the cause of diseases classified elsewhere: Secondary | ICD-10-CM | POA: Diagnosis not present

## 2018-07-14 DIAGNOSIS — J019 Acute sinusitis, unspecified: Secondary | ICD-10-CM | POA: Diagnosis not present

## 2018-09-20 DIAGNOSIS — J019 Acute sinusitis, unspecified: Secondary | ICD-10-CM | POA: Diagnosis not present

## 2018-09-20 DIAGNOSIS — B9689 Other specified bacterial agents as the cause of diseases classified elsewhere: Secondary | ICD-10-CM | POA: Diagnosis not present

## 2018-11-30 ENCOUNTER — Telehealth: Payer: Self-pay | Admitting: Nurse Practitioner

## 2018-11-30 NOTE — Telephone Encounter (Signed)

## 2018-12-01 ENCOUNTER — Other Ambulatory Visit: Payer: Self-pay

## 2018-12-01 ENCOUNTER — Encounter: Payer: Self-pay | Admitting: Nurse Practitioner

## 2018-12-01 ENCOUNTER — Encounter: Payer: Self-pay | Admitting: Internal Medicine

## 2018-12-01 ENCOUNTER — Ambulatory Visit (INDEPENDENT_AMBULATORY_CARE_PROVIDER_SITE_OTHER): Payer: BLUE CROSS/BLUE SHIELD | Admitting: Nurse Practitioner

## 2018-12-01 VITALS — BP 106/70 | HR 70 | Temp 96.7°F | Ht 65.0 in | Wt 180.6 lb

## 2018-12-01 DIAGNOSIS — Z13220 Encounter for screening for lipoid disorders: Secondary | ICD-10-CM

## 2018-12-01 DIAGNOSIS — Z136 Encounter for screening for cardiovascular disorders: Secondary | ICD-10-CM

## 2018-12-01 DIAGNOSIS — Z9049 Acquired absence of other specified parts of digestive tract: Secondary | ICD-10-CM | POA: Insufficient documentation

## 2018-12-01 DIAGNOSIS — K635 Polyp of colon: Secondary | ICD-10-CM

## 2018-12-01 DIAGNOSIS — N809 Endometriosis, unspecified: Secondary | ICD-10-CM | POA: Diagnosis not present

## 2018-12-01 DIAGNOSIS — Z0000 Encounter for general adult medical examination without abnormal findings: Secondary | ICD-10-CM

## 2018-12-01 NOTE — Progress Notes (Signed)
Subjective:    Patient ID: Janice Lyons, female    DOB: 15-Dec-1975, 43 y.o.   MRN: [id number]  Patient presents today for complete physical Denies any acute complaint   HPI Hx of colon polyp: needs refferal for repeat colonoscopy.  Sexual History (orientation,birth control, marital status, STD):s/p hysterectomy, up to date with mammogram  Depression/Suicide: Depression screen [facility] 2/9 12/01/2018 05/31/2017 05/17/2017 07/28/2015  Decreased Interest 0 0 0 0  Down, Depressed, Hopeless 0 0 0 0  PHQ - 2 Score 0 0 0 0   Vision:not needed per patient  Dental:up to date  Immunizations: (TDAP, Hep C screen, Pneumovax, Influenza, zoster)  Health Maintenance  Topic Date Due  . Colon Cancer Screening  12/26/2015  . Pap Smear  04/03/2017  . HIV Screening  12/01/2019*  . Tetanus Vaccine  11/19/2027  . Flu Shot  Completed  *Topic was postponed. The date shown is not the original due date.   Diet:regular.  Weight:  Wt Readings from Last 3 Encounters:  12/01/18 180 lb 9.6 oz (81.9 kg)  02/12/18 163 lb (73.9 kg)  05/31/17 170 lb 9.6 oz (77.4 kg)   Exercise:daily  Fall Risk: Fall Risk  12/01/2018 05/31/2017 05/17/2017 07/28/2015  Falls in the past year? 0 No No No   Advanced Directive: Advanced Directives 12/10/2011  Does Patient Have a Medical Advance Directive? Patient does not have advance directive     Medications and allergies reviewed with patient and updated if appropriate.  Patient Active Problem List   Diagnosis Date Noted  . Polyp of ascending colon 12/01/2018  . S/P laparoscopic cholecystectomy 12/01/2018  . Cigarette nicotine dependence without complication [id number]  . Abdominal distension 12/02/2015  . Endometrioma of ovary 05/10/2014  . IBS (irritable bowel syndrome) 07/05/2013  . Gastroesophageal reflux disease 11/02/2011  . Interstitial cystitis 11/02/2011  . S/P hysterectomy with oophorectomy 11/02/2011  . Diarrhea 09/01/2011  . Endometriosis 09/01/2011     Current Outpatient Medications on File Prior to Visit  Medication Sig Dispense Refill  . cetirizine (ZYRTEC) 10 MG tablet Take 10 mg by mouth daily.    Marland Kitchen estradiol (VIVELLE-DOT) 0.075 MG/24HR estradiol 0.075 mg/24hr pttw  Apply once a week,    . FIBER PO Take by mouth.    . Magnesium 400 MG CAPS Take by mouth.    . Multiple Vitamin (MULTI VITAMIN DAILY PO) Take by mouth.    Marland Kitchen omeprazole (PRILOSEC) 20 MG capsule Take 20 mg by mouth daily.    Marland Kitchen UNABLE TO FIND Vitamin D    . meloxicam (MOBIC) 7.5 MG tablet TAKE 1 TABLET(7.5 MG) BY MOUTH DAILY (Patient not taking: No sig reported) 30 tablet 0   No current facility-administered medications on file prior to visit.     Past Medical History:  Diagnosis Date  . Abdominal distension   . Abdominal pain   . Allergy   . Bruises easily   . Chest pain   . Chills   . Chronic cholecystitis 03/2012  . Complication of anesthesia    severe facial itching  . Constipation   . Endometriosis   . Gastritis   . Gastritis 03/2012  . Generalized headaches   . GERD (gastroesophageal reflux disease)   . Hyperplastic colon polyp   . Interstitial cystitis   . Microscopic colitis   . Nasal congestion   . Nausea   . Serrated adenoma of colon   . TMJ (dislocation of temporomandibular joint)   . Weakness   . Yeast infection  Past Surgical History:  Procedure Laterality Date  . ABDOMINAL HYSTERECTOMY    . BILATERAL TEMPOROMANDIBULAR JOINT ARTHROPLASTY    . BREAST SURGERY    . DIAGNOSTIC LAPAROSCOPY    . LAPAROSCOPIC LYSIS OF ADHESIONS  12/13/2011   Procedure: LAPAROSCOPIC LYSIS OF ADHESIONS;  Surgeon: Allena Katz, MD;  Location: [facility];  Service: Gynecology;  Laterality: N/A;  . LAPAROSCOPY  12/13/2011   Procedure: LAPAROSCOPY OPERATIVE;  Surgeon: Allena Katz, MD;  Location: [facility];  Service: Gynecology;  Laterality: N/A;  . LAPAROTOMY    . OVARIAN CYST REMOVAL  12/13/2011   Procedure: OVARIAN CYSTECTOMY;  Surgeon: Allena Katz, MD;  Location: [facility];  Service: Gynecology;  Laterality: Right;  Marland Kitchen VAGINAL DELIVERY  1998  . WISDOM TOOTH EXTRACTION      Social History   Socioeconomic History  . Marital status: Married    Spouse name: Not on file  . Number of children: 1  . Years of education: Not on file  . Highest education level: Not on file  Occupational History    Employer: [employer]  . Financial resource strain: Not on file  . Food insecurity    Worry: Not on file    Inability: Not on file  . Transportation needs    Medical: Not on file    Non-medical: Not on file  Tobacco Use  . Smoking status: Former Smoker    Quit date: 11/01/1993    Years since quitting: 25.1  . Smokeless tobacco: Never Used  Substance and Sexual Activity  . Alcohol use: Not Currently    Alcohol/week: 0.0 standard drinks    Comment: rarely  . Drug use: No  . Sexual activity: Not on file  Lifestyle  . Physical activity    Days per week: Not on file    Minutes per session: Not on file  . Stress: Not on file  Relationships  . Social Herbalist on phone: Not on file    Gets together: Not on file    Attends religious service: Not on file    Active member of club or organization: Not on file    Attends meetings of clubs or organizations: Not on file    Relationship status: Not on file  Other Topics Concern  . Not on file  Social History Narrative  . Not on file    Family History  Problem Relation Age of Onset  . Diabetes Maternal Grandfather   . Leukemia Maternal Grandfather   . Cancer Maternal Grandfather        leukemia  . Thyroid cancer Father   . Stroke Father   . Cancer Father   . Heart disease Father   . Hypertension Mother   . Diabetes Mother   . Cancer Paternal Grandmother        does not what type       Review of Systems  Constitutional: Negative for fever, malaise/fatigue and weight loss.  HENT: Negative for congestion and sore throat.   Eyes:       Negative for  visual changes  Respiratory: Negative for cough and shortness of breath.   Cardiovascular: Negative for chest pain, palpitations and leg swelling.  Gastrointestinal: Negative for blood in stool, constipation, diarrhea and heartburn.  Genitourinary: Negative for dysuria, frequency and urgency.  Musculoskeletal: Negative for falls, joint pain and myalgias.  Skin: Negative for rash.  Neurological: Negative for dizziness, sensory change  and headaches.  Endo/Heme/Allergies: Does not bruise/bleed easily.  Psychiatric/Behavioral: Negative for depression, substance abuse and suicidal ideas. The patient is not nervous/anxious.     Objective:   Vitals:   12/01/18 1502  BP: 106/70  Pulse: 70  Temp: (!) 96.7 F (35.9 C)  SpO2: 97%    Body mass index is 30.05 kg/m.   Physical Examination:  Physical Exam Vitals signs reviewed.  Constitutional:      General: She is not in acute distress.    Appearance: She is well-developed.  HENT:     Right Ear: Tympanic membrane, ear canal and external ear normal.     Left Ear: Tympanic membrane, ear canal and external ear normal.  Eyes:     Extraocular Movements: Extraocular movements intact.     Conjunctiva/sclera: Conjunctivae normal.     Pupils: Pupils are equal, round, and reactive to light.  Neck:     Musculoskeletal: Normal range of motion and neck supple.  Cardiovascular:     Rate and Rhythm: Normal rate and regular rhythm.     Heart sounds: Normal heart sounds.  Pulmonary:     Effort: Pulmonary effort is normal. No respiratory distress.     Breath sounds: Normal breath sounds.  Chest:     Chest wall: No tenderness.  Abdominal:     General: Bowel sounds are normal.     Palpations: Abdomen is soft.  Genitourinary:    Comments: Deferred breast and pelvic exam to GYN per patient Musculoskeletal: Normal range of motion.     Right lower leg: No edema.     Left lower leg: No edema.  Lymphadenopathy:     Cervical: No cervical  adenopathy.  Neurological:     Mental Status: She is alert and oriented to person, place, and time.     Deep Tendon Reflexes: Reflexes are normal and symmetric.  Psychiatric:        Mood and Affect: Mood normal.        Behavior: Behavior normal.        Thought Content: Thought content normal.    ASSESSMENT and PLAN:  Jeydi was seen today for establish care.  Diagnoses and all orders for this visit:  Preventative health care -     CBC -     CMP -     TSH -     HTN_4 Lipid panel  Encounter for lipid screening for cardiovascular disease -     HTN_4 Lipid panel  Polyp of ascending colon, unspecified type -     Ambulatory referral to Gastroenterology  S/P laparoscopic cholecystectomy  Endometriosis   Endometriosis 2016 by [facility] Robotic-assisted laparoscopic resection of right-sided ovarian endometriosis 2. Exploratory laparotomy with rectosigmoid resection 3. Sigmoid colo-rectal stapled anastomosis 4. Proctoscopy      Problem List Items Addressed This Visit      Digestive   Polyp of ascending colon   Relevant Orders   Ambulatory referral to Gastroenterology     Other   Endometriosis    2016 by [facility] Robotic-assisted laparoscopic resection of right-sided ovarian endometriosis 2. Exploratory laparotomy with rectosigmoid resection 3. Sigmoid colo-rectal stapled anastomosis 4. Proctoscopy       S/P laparoscopic cholecystectomy    Other Visit Diagnoses    Preventative health care    -  Primary   Relevant Orders   CBC (Completed)   CMP (Completed)   TSH (Completed)   HTN_4 Lipid panel (Completed)   Encounter for lipid screening for cardiovascular disease  Relevant Orders   HTN_4 Lipid panel (Completed)       Follow up: Return in about 1 year (around 12/01/2019) for CPE (fasting).  Wilfred Lacy, NP

## 2018-12-01 NOTE — Assessment & Plan Note (Addendum)
2016 by [facility] Robotic-assisted laparoscopic resection of right-sided ovarian endometriosis 2. Exploratory laparotomy with rectosigmoid resection 3. Sigmoid colo-rectal stapled anastomosis 4. Proctoscopy

## 2018-12-01 NOTE — Patient Instructions (Addendum)
Thank you for choosing [facility] for your health care needs.  You will be contacted to schedule appt with GI to discuss repeat colonoscopy  Sign medical release form to get PAP and mammogram results from GYN.  Go to lab for blood draw.   Health Maintenance, Female Adopting a healthy lifestyle and getting preventive care are important in promoting health and wellness. Ask your health care provider about:  The right schedule for you to have regular tests and exams.  Things you can do on your own to prevent diseases and keep yourself healthy. What should I know about diet, weight, and exercise? Eat a healthy diet   Eat a diet that includes plenty of vegetables, fruits, low-fat dairy products, and lean protein.  Do not eat a lot of foods that are high in solid fats, added sugars, or sodium. Maintain a healthy weight Body mass index (BMI) is used to identify weight problems. It estimates body fat based on height and weight. Your health care provider can help determine your BMI and help you achieve or maintain a healthy weight. Get regular exercise Get regular exercise. This is one of the most important things you can do for your health. Most adults should:  Exercise for at least 150 minutes each week. The exercise should increase your heart rate and make you sweat (moderate-intensity exercise).  Do strengthening exercises at least twice a week. This is in addition to the moderate-intensity exercise.  Spend less time sitting. Even light physical activity can be beneficial. Watch cholesterol and blood lipids Have your blood tested for lipids and cholesterol at 42 years of age, then have this test every 5 years. Have your cholesterol levels checked more often if:  Your lipid or cholesterol levels are high.  You are older than 42 years of age.  You are at high risk for heart disease. What should I know about cancer screening? Depending on your health history and family history, you may  need to have cancer screening at various ages. This may include screening for:  Breast cancer.  Cervical cancer.  Colorectal cancer.  Skin cancer.  Lung cancer. What should I know about heart disease, diabetes, and high blood pressure? Blood pressure and heart disease  High blood pressure causes heart disease and increases the risk of stroke. This is more likely to develop in people who have high blood pressure readings, are of African descent, or are overweight.  Have your blood pressure checked: ? Every 3-5 years if you are 36-77 years of age. ? Every year if you are 48 years old or older. Diabetes Have regular diabetes screenings. This checks your fasting blood sugar level. Have the screening done:  Once every three years after age 14 if you are at a normal weight and have a low risk for diabetes.  More often and at a younger age if you are overweight or have a high risk for diabetes. What should I know about preventing infection? Hepatitis B If you have a higher risk for hepatitis B, you should be screened for this virus. Talk with your health care provider to find out if you are at risk for hepatitis B infection. Hepatitis C Testing is recommended for:  Everyone born from [birth year] through 1964.  Anyone with known risk factors for hepatitis C. Sexually transmitted infections (STIs)  Get screened for STIs, including gonorrhea and chlamydia, if: ? You are sexually active and are younger than 42 years of age. ? You are older than 42 years  of age and your health care provider tells you that you are at risk for this type of infection. ? Your sexual activity has changed since you were last screened, and you are at increased risk for chlamydia or gonorrhea. Ask your health care provider if you are at risk.  Ask your health care provider about whether you are at high risk for HIV. Your health care provider may recommend a prescription medicine to help prevent HIV infection. If you  choose to take medicine to prevent HIV, you should first get tested for HIV. You should then be tested every 3 months for as long as you are taking the medicine. Pregnancy  If you are about to stop having your period (premenopausal) and you may become pregnant, seek counseling before you get pregnant.  Take 400 to 800 micrograms (mcg) of folic acid every day if you become pregnant.  Ask for birth control (contraception) if you want to prevent pregnancy. Osteoporosis and menopause Osteoporosis is a disease in which the bones lose minerals and strength with aging. This can result in bone fractures. If you are 50 years old or older, or if you are at risk for osteoporosis and fractures, ask your health care provider if you should:  Be screened for bone loss.  Take a calcium or vitamin D supplement to lower your risk of fractures.  Be given hormone replacement therapy (HRT) to treat symptoms of menopause. Follow these instructions at home: Lifestyle  Do not use any products that contain nicotine or tobacco, such as cigarettes, e-cigarettes, and chewing tobacco. If you need help quitting, ask your health care provider.  Do not use street drugs.  Do not share needles.  Ask your health care provider for help if you need support or information about quitting drugs. Alcohol use  Do not drink alcohol if: ? Your health care provider tells you not to drink. ? You are pregnant, may be pregnant, or are planning to become pregnant.  If you drink alcohol: ? Limit how much you use to 0-1 drink a day. ? Limit intake if you are breastfeeding.  Be aware of how much alcohol is in your drink. In the [place] one drink equals one 12 oz bottle of beer (355 mL), one 5 oz glass of wine (148 mL), or one 1 oz glass of hard liquor (44 mL). General instructions  Schedule regular health, dental, and eye exams.  Stay current with your vaccines.  Tell your health care provider if: ? You often feel depressed.  ? You have ever been abused or do not feel safe at home. Summary  Adopting a healthy lifestyle and getting preventive care are important in promoting health and wellness.  Follow your health care provider's instructions about healthy diet, exercising, and getting tested or screened for diseases.  Follow your health care provider's instructions on monitoring your cholesterol and blood pressure. This information is not intended to replace advice given to you by your health care provider. Make sure you discuss any questions you have with your health care provider. Document Released: 07/27/2010 Document Revised: 01/04/2018 Document Reviewed: 01/04/2018 Elsevier Patient Education  2020 [employer].

## 2018-12-02 LAB — LIPID PANEL
Cholesterol: 195 mg/dL (ref <200)
HDL: 62 mg/dL (ref >=50)
LDL Cholesterol (Calc): 112 mg/dL (calc) — ABNORMAL HIGH
Non-HDL Cholesterol (Calc): 133 mg/dL (calc) — ABNORMAL HIGH (ref <130)
Total CHOL/HDL Ratio: 3.1 (calc) (ref <5.0)
Triglycerides: 99 mg/dL (ref <150)

## 2018-12-02 LAB — CBC
HCT: 37.4 % (ref 35.0–45.0)
Hemoglobin: 12.5 g/dL (ref 11.7–15.5)
MCH: 29.7 pg (ref 27.0–33.0)
MCHC: 33.4 g/dL (ref 32.0–36.0)
MCV: 88.8 fL (ref 80.0–100.0)
MPV: 12.7 fL — ABNORMAL HIGH (ref 7.5–12.5)
Platelets: 175 10*3/uL (ref 140–400)
RBC: 4.21 10*6/uL (ref 3.80–5.10)
RDW: 12.3 % (ref 11.0–15.0)
WBC: 6.1 10*3/uL (ref 3.8–10.8)

## 2018-12-02 LAB — COMPREHENSIVE METABOLIC PANEL
AG Ratio: 1.7 (calc) (ref 1.0–2.5)
ALT: 17 U/L (ref 6–29)
AST: 18 U/L (ref 10–30)
Albumin: 4.3 g/dL (ref 3.6–5.1)
Alkaline phosphatase (APISO): 36 U/L (ref 31–125)
BUN: 13 mg/dL (ref 7–25)
CO2: 25 mmol/L (ref 20–32)
Calcium: 9.3 mg/dL (ref 8.6–10.2)
Chloride: 104 mmol/L (ref 98–110)
Creat: 0.92 mg/dL (ref 0.50–1.10)
Globulin: 2.5 g/dL (calc) (ref 1.9–3.7)
Glucose, Bld: 83 mg/dL (ref 65–99)
Potassium: 3.7 mmol/L (ref 3.5–5.3)
Sodium: 138 mmol/L (ref 135–146)
Total Bilirubin: 0.2 mg/dL (ref 0.2–1.2)
Total Protein: 6.8 g/dL (ref 6.1–8.1)

## 2018-12-02 LAB — TSH: TSH: 0.77 mIU/L

## 2019-01-11 ENCOUNTER — Encounter: Payer: BLUE CROSS/BLUE SHIELD | Admitting: Internal Medicine

## 2019-01-29 ENCOUNTER — Encounter: Payer: Self-pay | Admitting: Nurse Practitioner

## 2019-02-06 DIAGNOSIS — E8941 Symptomatic postprocedural ovarian failure: Secondary | ICD-10-CM | POA: Diagnosis not present

## 2019-02-06 DIAGNOSIS — Z01419 Encounter for gynecological examination (general) (routine) without abnormal findings: Secondary | ICD-10-CM | POA: Diagnosis not present

## 2019-02-06 DIAGNOSIS — Z1231 Encounter for screening mammogram for malignant neoplasm of breast: Secondary | ICD-10-CM | POA: Diagnosis not present

## 2019-02-06 DIAGNOSIS — Z6830 Body mass index (BMI) 30.0-30.9, adult: Secondary | ICD-10-CM | POA: Diagnosis not present

## 2019-03-10 DIAGNOSIS — Z20828 Contact with and (suspected) exposure to other viral communicable diseases: Secondary | ICD-10-CM | POA: Diagnosis not present

## 2019-03-10 DIAGNOSIS — J3489 Other specified disorders of nose and nasal sinuses: Secondary | ICD-10-CM | POA: Diagnosis not present

## 2019-04-25 ENCOUNTER — Encounter: Payer: Self-pay | Admitting: Internal Medicine

## 2019-05-25 ENCOUNTER — Other Ambulatory Visit: Payer: Self-pay

## 2019-05-25 ENCOUNTER — Ambulatory Visit (AMBULATORY_SURGERY_CENTER): Payer: Self-pay | Admitting: *Deleted

## 2019-05-25 VITALS — Temp 97.1°F | Ht 65.0 in | Wt 177.4 lb

## 2019-05-25 DIAGNOSIS — Z86010 Personal history of colonic polyps: Secondary | ICD-10-CM

## 2019-05-25 DIAGNOSIS — Z01818 Encounter for other preprocedural examination: Secondary | ICD-10-CM

## 2019-05-25 MED ORDER — SUTAB 1479-225-188 MG PO TABS: 1.0000 | ORAL_TABLET | Freq: Once | ORAL | 0 refills | Status: AC

## 2019-05-25 NOTE — Progress Notes (Signed)
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 Patient is aware to bring only one care partner. We want them to wear a mask (we do not have any that we can provide them), practice social distancing, and we will check their temperatures when they get here.  I did remind the patient that their care partner needs to stay in the parking lot the entire time and have a cell phone available, we will call them when the pt is ready for discharge. Patient will wear mask into building.  covid test 06-01-19 at 3:20 pm   No egg or soy allergy  No home oxygen use   No medications for weight loss taken   Sutab code put into RX and paper copy given to pt to show pharmacy

## 2019-06-05 ENCOUNTER — Encounter: Payer: BLUE CROSS/BLUE SHIELD | Admitting: Internal Medicine

## 2019-06-06 ENCOUNTER — Telehealth: Payer: Self-pay | Admitting: Internal Medicine

## 2019-06-06 NOTE — Telephone Encounter (Signed)
Patient calling in reference to prep medication tabs states they are too expensive for her and is looking for alternate medication. Was not sure for there is no prep on med list please advise

## 2019-06-06 NOTE — Telephone Encounter (Signed)
Spoke with pharmacy and they ran the coupon code for pt- price dropped from $160 to $40.  Pt made aware and ok with this price.

## 2019-06-12 ENCOUNTER — Encounter: Payer: Self-pay | Admitting: Internal Medicine

## 2019-06-15 ENCOUNTER — Other Ambulatory Visit: Payer: Self-pay | Admitting: Internal Medicine

## 2019-06-15 ENCOUNTER — Ambulatory Visit (INDEPENDENT_AMBULATORY_CARE_PROVIDER_SITE_OTHER): Payer: BLUE CROSS/BLUE SHIELD

## 2019-06-15 DIAGNOSIS — Z1159 Encounter for screening for other viral diseases: Secondary | ICD-10-CM | POA: Diagnosis not present

## 2019-06-16 LAB — SARS CORONAVIRUS 2 (TAT 6-24 HRS): SARS Coronavirus 2: NEGATIVE

## 2019-06-19 ENCOUNTER — Ambulatory Visit (AMBULATORY_SURGERY_CENTER): Payer: BLUE CROSS/BLUE SHIELD | Admitting: Internal Medicine

## 2019-06-19 ENCOUNTER — Other Ambulatory Visit: Payer: Self-pay

## 2019-06-19 ENCOUNTER — Encounter: Payer: Self-pay | Admitting: Internal Medicine

## 2019-06-19 VITALS — BP 104/52 | HR 71 | Temp 96.6°F | Resp 15 | Ht 65.0 in | Wt 177.4 lb

## 2019-06-19 DIAGNOSIS — K635 Polyp of colon: Secondary | ICD-10-CM | POA: Diagnosis not present

## 2019-06-19 DIAGNOSIS — Z86010 Personal history of colonic polyps: Secondary | ICD-10-CM | POA: Diagnosis not present

## 2019-06-19 DIAGNOSIS — D123 Benign neoplasm of transverse colon: Secondary | ICD-10-CM

## 2019-06-19 DIAGNOSIS — Z1211 Encounter for screening for malignant neoplasm of colon: Secondary | ICD-10-CM | POA: Diagnosis not present

## 2019-06-19 DIAGNOSIS — D120 Benign neoplasm of cecum: Secondary | ICD-10-CM

## 2019-06-19 MED ORDER — SODIUM CHLORIDE 0.9 % IV SOLN: 500.0000 mL | Freq: Once | INTRAVENOUS | Status: DC

## 2019-06-19 NOTE — Op Note (Signed)
[facility] Patient Name: Janice Lyons Procedure Date: 06/19/2019 8:05 AM MRN: [id number] Endoscopist: Jerene Bears , MD Age: 44 Referring MD:  Date of Birth: February 18, 1975 Gender: Female Account #: [id number] Procedure:                Colonoscopy Indications:              High risk colon cancer surveillance: Personal                            history of sessile serrated colon polyp (less than                            10 mm in size) with no dysplasia, Last colonoscopy:                            December 2014 Medicines:                Monitored Anesthesia Care Procedure:                Pre-Anesthesia Assessment:                           - Prior to the procedure, a History and Physical                            was performed, and patient medications and                            allergies were reviewed. The patient's tolerance of                            previous anesthesia was also reviewed. The risks                            and benefits of the procedure and the sedation                            options and risks were discussed with the patient.                            All questions were answered, and informed consent                            was obtained. Prior Anticoagulants: The patient has                            taken no previous anticoagulant or antiplatelet                            agents. ASA Grade Assessment: II - A patient with                            mild systemic disease. After reviewing the risks  and benefits, the patient was deemed in                            satisfactory condition to undergo the procedure.                           After obtaining informed consent, the colonoscope                            was passed under direct vision. Throughout the                            procedure, the patient's blood pressure, pulse, and                            oxygen saturations were monitored continuously.  The                            Colonoscope was introduced through the anus and                            advanced to the cecum, identified by appendiceal                            orifice and ileocecal valve. The colonoscopy was                            performed without difficulty. The patient tolerated                            the procedure well. The quality of the bowel                            preparation was good. The ileocecal valve,                            appendiceal orifice, and rectum were photographed. Scope In: 8:18:50 AM Scope Out: 8:38:38 AM Scope Withdrawal Time: 0 hours 18 minutes 6 seconds  Total Procedure Duration: 0 hours 19 minutes 48 seconds  Findings:                 The digital rectal exam was normal.                           A 7 mm polyp was found in the cecum. The polyp was                            flat. The polyp was removed with a cold snare.                            Resection and retrieval were complete.                           A 6 mm polyp was found in  the transverse colon. The                            polyp was sessile. The polyp was removed with a                            cold snare. Resection and retrieval were complete.                           A few small-mouthed diverticula were found in the                            sigmoid colon.                           The retroflexed view of the distal rectum and anal                            verge was normal and showed no anal or rectal                            abnormalities. Complications:            No immediate complications. Estimated Blood Loss:     Estimated blood loss was minimal. Impression:               - One 7 mm polyp in the cecum, removed with a cold                            snare. Resected and retrieved.                           - One 6 mm polyp in the transverse colon, removed                            with a cold snare. Resected and retrieved.                            - Very mild diverticulosis in the sigmoid colon.                           - The distal rectum and anal verge are normal on                            retroflexion view. Recommendation:           - Patient has a contact number available for                            emergencies. The signs and symptoms of potential                            delayed complications were discussed with the  patient. Return to normal activities tomorrow.                            Written discharge instructions were provided to the                            patient.                           - Resume previous diet.                           - Continue present medications.                           - Await pathology results.                           - Repeat colonoscopy is recommended for                            surveillance. The colonoscopy date will be                            determined after pathology results from today's                            exam become available for review. Jerene Bears, MD 06/19/2019 8:50:53 AM This report has been signed electronically.

## 2019-06-19 NOTE — Progress Notes (Signed)
Pt's states no medical or surgical changes since previsit or office visit.  Vs -CW 

## 2019-06-19 NOTE — Patient Instructions (Signed)
Handouts on polyps and diverticulosis given to you today  °Await pathology results  ° °YOU HAD AN ENDOSCOPIC PROCEDURE TODAY AT THE [facility]:   Refer to the procedure report that was given to you for any specific questions about what was found during the examination.  If the procedure report does not answer your questions, please call your gastroenterologist to clarify.  If you requested that your care partner not be given the details of your procedure findings, then the procedure report has been included in a sealed envelope for you to review at your convenience later. ° °YOU SHOULD EXPECT: Some feelings of bloating in the abdomen. Passage of more gas than usual.  Walking can help get rid of the air that was put into your GI tract during the procedure and reduce the bloating. If you had a lower endoscopy (such as a colonoscopy or flexible sigmoidoscopy) you may notice spotting of blood in your stool or on the toilet paper. If you underwent a bowel prep for your procedure, you may not have a normal bowel movement for a few days. ° °Please Note:  You might notice some irritation and congestion in your nose or some drainage.  This is from the oxygen used during your procedure.  There is no need for concern and it should clear up in a day or so. ° °SYMPTOMS TO REPORT IMMEDIATELY: ° °Following lower endoscopy (colonoscopy or flexible sigmoidoscopy): ° Excessive amounts of blood in the stool ° Significant tenderness or worsening of abdominal pains ° Swelling of the abdomen that is new, acute ° Fever of 100°F or higher ° °For urgent or emergent issues, a gastroenterologist can be reached at any hour by calling [phone]. °Do not use MyChart messaging for urgent concerns.  ° ° °DIET:  We do recommend a small meal at first, but then you may proceed to your regular diet.  Drink plenty of fluids but you should avoid alcoholic beverages for 24 hours. ° °ACTIVITY:  You should plan to take it easy for the  rest of today and you should NOT DRIVE or use heavy machinery until tomorrow (because of the sedation medicines used during the test).   ° °FOLLOW UP: °Our staff will call the number listed on your records 48-72 hours following your procedure to check on you and address any questions or concerns that you may have regarding the information given to you following your procedure. If we do not reach you, we will leave a message.  We will attempt to reach you two times.  During this call, we will ask if you have developed any symptoms of COVID 19. If you develop any symptoms (ie: fever, flu-like symptoms, shortness of breath, cough etc.) before then, please call [phone].  If you test positive for Covid 19 in the 2 weeks post procedure, please call and report this information to us.   ° °If any biopsies were taken you will be contacted by phone or by letter within the next 1-3 weeks.  Please call us at [phone] if you have not heard about the biopsies in 3 weeks.  ° ° °SIGNATURES/CONFIDENTIALITY: °You and/or your care partner have signed paperwork which will be entered into your electronic medical record.  These signatures attest to the fact that that the information above on your After Visit Summary has been reviewed and is understood.  Full responsibility of the confidentiality of this discharge information lies with you and/or your care-partner.  °

## 2019-06-19 NOTE — Progress Notes (Signed)
pt tolerated well. VSS. awake and to recovery. Report given to RN.  

## 2019-06-19 NOTE — Progress Notes (Signed)
Called to room to assist during endoscopic procedure.  Patient ID and intended procedure confirmed with present staff. Received instructions for my participation in the procedure from the performing physician.  

## 2019-06-21 ENCOUNTER — Telehealth: Payer: Self-pay

## 2019-06-21 ENCOUNTER — Encounter: Payer: Self-pay | Admitting: Internal Medicine

## 2019-06-21 NOTE — Telephone Encounter (Signed)
Left message on follow up call. 

## 2019-06-21 NOTE — Telephone Encounter (Signed)
Left message on 2nd follow up call. 

## 2019-07-31 DIAGNOSIS — L258 Unspecified contact dermatitis due to other agents: Secondary | ICD-10-CM | POA: Diagnosis not present

## 2019-08-29 DIAGNOSIS — J019 Acute sinusitis, unspecified: Secondary | ICD-10-CM | POA: Diagnosis not present

## 2019-08-29 DIAGNOSIS — B9689 Other specified bacterial agents as the cause of diseases classified elsewhere: Secondary | ICD-10-CM | POA: Diagnosis not present

## 2019-08-30 DIAGNOSIS — Z20822 Contact with and (suspected) exposure to covid-19: Secondary | ICD-10-CM | POA: Diagnosis not present

## 2019-09-05 ENCOUNTER — Telehealth: Payer: Self-pay | Admitting: Nurse Practitioner

## 2019-09-05 NOTE — Telephone Encounter (Signed)
Patient scheduled an appointment for 08/12 @ 8:00 to be seen for sinus infection. Tried to call patient to reschedule for a VV, left message to give the office a call back. Baldo Ash does not have anything available for a MyChart Visit, but Abby Potash has some openings tomorrow afternoon. Please offer MyChart visit with [facility] if patient calls back.

## 2019-09-06 ENCOUNTER — Ambulatory Visit: Payer: BLUE CROSS/BLUE SHIELD | Admitting: Nurse Practitioner

## 2019-09-06 ENCOUNTER — Telehealth (INDEPENDENT_AMBULATORY_CARE_PROVIDER_SITE_OTHER): Payer: BLUE CROSS/BLUE SHIELD | Admitting: Family

## 2019-09-06 ENCOUNTER — Encounter: Payer: Self-pay | Admitting: Family

## 2019-09-06 VITALS — Ht 65.0 in | Wt 168.0 lb

## 2019-09-06 DIAGNOSIS — B9789 Other viral agents as the cause of diseases classified elsewhere: Secondary | ICD-10-CM

## 2019-09-06 DIAGNOSIS — J019 Acute sinusitis, unspecified: Secondary | ICD-10-CM | POA: Diagnosis not present

## 2019-09-06 MED ORDER — DOXYCYCLINE HYCLATE 100 MG PO TABS: 100.0000 mg | ORAL_TABLET | Freq: Two times a day (BID) | ORAL | 0 refills | Status: DC

## 2019-09-06 NOTE — Progress Notes (Signed)
Virtual Visit via Video   I connected with patient on 09/06/19 at  1:00 PM EDT by a video enabled telemedicine application and verified that I am speaking with the correct person using two identifiers.  Location patient: Home Location provider: [facility] Persons participating in the virtual visit: Patient, Provider, CMA I discussed the limitations of evaluation and management by telemedicine and the availability of in person appointments. The patient expressed understanding and agreed to proceed.  Subjective:   HPI:   44 year old female requests a videovisit after not feeling much better from sinusitis and taking Augmentin. Patient admits that she has not taken the Augmentin as prescribed because it cause her to have upset stomach and diarrhea. She would like to switch antibiotics. Continues to have pressure in her face and congestion ROS:   See pertinent positives and negatives per HPI.  Patient Active Problem List   Diagnosis Date Noted  . Polyp of ascending colon 12/01/2018  . S/P laparoscopic cholecystectomy 12/01/2018  . Cigarette nicotine dependence without complication 51/76/1607  . Abdominal distension 12/02/2015  . Endometrioma of ovary 05/10/2014  . IBS (irritable bowel syndrome) 07/05/2013  . Gastroesophageal reflux disease 11/02/2011  . Interstitial cystitis 11/02/2011  . S/P hysterectomy with oophorectomy 11/02/2011  . Diarrhea 09/01/2011  . Endometriosis 09/01/2011    Social History   Tobacco Use  . Smoking status: Former Smoker    Quit date: 11/01/1993    Years since quitting: 25.8  . Smokeless tobacco: Never Used  Substance Use Topics  . Alcohol use: Not Currently    Alcohol/week: 0.0 standard drinks    Comment: rarely    Current Outpatient Medications:  .  BIOTIN PO, Take by mouth daily., Disp: , Rfl:  .  cefdinir (OMNICEF) 300 MG capsule, Take by mouth., Disp: , Rfl:  .  cetirizine (ZYRTEC) 10 MG tablet, Take 10 mg by mouth  daily., Disp: , Rfl:  .  estradiol (ESTRACE) 1 MG tablet, daily., Disp: , Rfl:  .  FIBER PO, Take by mouth., Disp: , Rfl:  .  Magnesium 400 MG CAPS, Take by mouth., Disp: , Rfl:  .  Multiple Vitamin (MULTI VITAMIN DAILY PO), Take by mouth., Disp: , Rfl:  .  omeprazole (PRILOSEC) 20 MG capsule, Take 20 mg by mouth daily., Disp: , Rfl:  .  UNABLE TO FIND, Vitamin D, Disp: , Rfl:  .  doxycycline (VIBRA-TABS) 100 MG tablet, Take 1 tablet (100 mg total) by mouth 2 (two) times daily., Disp: 20 tablet, Rfl: 0 .  fluconazole (DIFLUCAN) 150 MG tablet, Take 150 mg by mouth once. (Patient not taking: Reported on 09/06/2019), Disp: , Rfl:  .  meloxicam (MOBIC) 7.5 MG tablet, TAKE 1 TABLET(7.5 MG) BY MOUTH DAILY (Patient not taking: No sig reported), Disp: 30 tablet, Rfl: 0 .  predniSONE (DELTASONE) 20 MG tablet, Take by mouth. (Patient not taking: Reported on 09/06/2019), Disp: , Rfl:   Allergies  Allergen Reactions  . Ketorolac Itching  . Oxycodone Itching, Hives and Nausea And Vomiting    Facial itching  . Lactase   . Lac Bovis Nausea Only  . Milk Protein Diarrhea  . Tape Rash    Glue used during surgery   . Uribel [Meth-Hyo-M Bl-Na Phos-Ph Sal] Itching, Nausea Only and Palpitations    Objective:   Ht 5\' 5"  (1.651 m)   Wt 168 lb (76.2 kg)   BMI 27.96 kg/m   Patient is well-developed, well-nourished in no acute distress.  Resting  comfortably at home.  Head is normocephalic, atraumatic.  No labored breathing.  Speech is clear and coherent with logical content.  Patient is alert and oriented at baseline.    Assessment and Plan:    Malu was seen today for sinusitis.  Diagnoses and all orders for this visit:  Acute viral sinusitis  Other orders -     doxycycline (VIBRA-TABS) 100 MG tablet; Take 1 tablet (100 mg total) by mouth 2 (two) times daily.  Stop Augmentin  And start Doxy. Flonase 2 sprays in each nostril once a day   Kennyth Arnold, FNP 09/06/2019  Time s

## 2019-12-06 ENCOUNTER — Ambulatory Visit: Payer: BLUE CROSS/BLUE SHIELD | Admitting: Nurse Practitioner

## 2020-01-02 DIAGNOSIS — M542 Cervicalgia: Secondary | ICD-10-CM | POA: Diagnosis not present

## 2020-01-02 DIAGNOSIS — M25512 Pain in left shoulder: Secondary | ICD-10-CM | POA: Diagnosis not present

## 2020-01-10 ENCOUNTER — Telehealth: Payer: Self-pay | Admitting: Nurse Practitioner

## 2020-01-10 ENCOUNTER — Encounter: Payer: Self-pay | Admitting: Nurse Practitioner

## 2020-01-10 NOTE — Telephone Encounter (Signed)
Pt was no show for appt 12/06/2019 acute. 1st occurrence. Fee waived. Letter mailed.

## 2020-04-17 LAB — HM PAP SMEAR

## 2020-04-17 LAB — HM MAMMOGRAPHY

## 2020-06-12 ENCOUNTER — Encounter: Payer: Self-pay | Admitting: Nurse Practitioner

## 2020-06-12 ENCOUNTER — Ambulatory Visit: Payer: BC Managed Care – PPO | Admitting: Nurse Practitioner

## 2020-06-12 ENCOUNTER — Other Ambulatory Visit: Payer: Self-pay

## 2020-06-12 VITALS — BP 116/74 | HR 83 | Temp 97.3°F | Wt 177.4 lb

## 2020-06-12 DIAGNOSIS — F411 Generalized anxiety disorder: Secondary | ICD-10-CM | POA: Diagnosis not present

## 2020-06-12 MED ORDER — DULOXETINE HCL 20 MG PO CPEP: 20.0000 mg | ORAL_CAPSULE | Freq: Every day | ORAL | 5 refills | Status: DC

## 2020-06-12 NOTE — Progress Notes (Signed)
Subjective:  Patient ID: Janice Lyons, female    DOB: 02-09-1975  Age: 45 y.o. MRN: [id number]  CC: Acute Visit (Pt is here to be seen for anxiety, depression, and stress. Pt is having some home problems that have cause anxiety to be more difficult to deal with, feel that she is at her witts end and would like some to help cope, pt would also like a referral to a therapist. )  HPI  GAD (generalized anxiety disorder) Ongoing for 26yrs, worse in last 4yrs due to husband's poor health, financial strain, increase work demand and loss of grandson(stillborn). No medication used in past. Wants to start medication and therapy sessions. Does not want any benzodiazepine rx due to fear of dependence. No SI or hallucination, no ETOH use of illicit drug use.  Start cymbalta Entered psychology referral f/up in 36month  Depression screen [facility] 2/9 06/12/2020 12/01/2018 05/31/2017  Decreased Interest 2 0 0  Down, Depressed, Hopeless 1 0 0  PHQ - 2 Score 3 0 0  Altered sleeping 1 - -  Tired, decreased energy 1 - -  Change in appetite 1 - -  Feeling bad or failure about yourself  0 - -  Trouble concentrating 3 - -  Moving slowly or fidgety/restless 0 - -  Suicidal thoughts 0 - -  PHQ-9 Score 9 - -  Difficult doing work/chores Somewhat difficult - -   GAD 7 : Generalized Anxiety Score 06/12/2020  Nervous, Anxious, on Edge 3  Control/stop worrying 3  Worry too much - different things 3  Trouble relaxing 3  Restless 3  Easily annoyed or irritable 3  Afraid - awful might happen 3  Total GAD 7 Score 21  Anxiety Difficulty Very difficult   Reviewed past Medical, Social and Family history today.  Outpatient Medications Prior to Visit  Medication Sig Dispense Refill  . BIOTIN PO Take by mouth daily.    . cetirizine (ZYRTEC) 10 MG tablet Take 10 mg by mouth daily.    Marland Kitchen doxycycline (VIBRA-TABS) 100 MG tablet Take 1 tablet (100 mg total) by mouth 2 (two) times daily. 20 tablet 0  . estradiol  (ESTRACE) 1 MG tablet daily.    Marland Kitchen FIBER PO Take by mouth.    . fluconazole (DIFLUCAN) 150 MG tablet Take 150 mg by mouth once.    . Magnesium 400 MG CAPS Take by mouth.    . meloxicam (MOBIC) 7.5 MG tablet TAKE 1 TABLET(7.5 MG) BY MOUTH DAILY 30 tablet 0  . Multiple Vitamin (MULTI VITAMIN DAILY PO) Take by mouth.    Marland Kitchen omeprazole (PRILOSEC) 20 MG capsule Take 20 mg by mouth daily.    . predniSONE (DELTASONE) 20 MG tablet Take by mouth.    Marland Kitchen UNABLE TO FIND Vitamin D     No facility-administered medications prior to visit.    ROS See HPI  Objective:  BP 116/74 (BP Location: Left Arm, Patient Position: Sitting, Cuff Size: Normal)   Pulse 83   Temp (!) 97.3 F (36.3 C) (Oral)   Wt 177 lb 6.4 oz (80.5 kg)   SpO2 98%   BMI 29.52 kg/m   Physical Exam Cardiovascular:     Rate and Rhythm: Normal rate.     Pulses: Normal pulses.  Pulmonary:     Effort: Pulmonary effort is normal.  Neurological:     Mental Status: She is alert and oriented to person, place, and time.  Psychiatric:        Attention  and Perception: Attention normal.        Mood and Affect: Mood is anxious. Affect is tearful.        Speech: Speech normal.        Behavior: Behavior is cooperative.        Thought Content: Thought content normal.        Cognition and Memory: Cognition normal.        Judgment: Judgment normal.     Assessment & Plan:  This visit occurred during the SARS-CoV-2 public health emergency.  Safety protocols were in place, including screening questions prior to the visit, additional usage of staff PPE, and extensive cleaning of exam room while observing appropriate contact time as indicated for disinfecting solutions.   [place] was seen today for acute visit.  Diagnoses and all orders for this visit:  GAD (generalized anxiety disorder) -     DULoxetine (CYMBALTA) 20 MG capsule; Take 1 capsule (20 mg total) by mouth daily. -     Ambulatory referral to Psychology   Problem List Items  Addressed This Visit      Other   GAD (generalized anxiety disorder) - Primary    Ongoing for 49yrs, worse in last 72yrs due to husband's poor health, financial strain, increase work demand and loss of grandson(stillborn). No medication used in past. Wants to start medication and therapy sessions. Does not want any benzodiazepine rx due to fear of dependence. No SI or hallucination, no ETOH use of illicit drug use.  Start cymbalta Entered psychology referral f/up in 82month      Relevant Medications   DULoxetine (CYMBALTA) 20 MG capsule   Other Relevant Orders   Ambulatory referral to Psychology      Follow-up: Return in about 4 weeks (around 07/10/2020) for Anxiety (21mins).  Wilfred Lacy, NP

## 2020-06-12 NOTE — Assessment & Plan Note (Signed)
Ongoing for 92yrs, worse in last 26yrs due to husband's poor health, financial strain, increase work demand and loss of grandson(stillborn). No medication used in past. Wants to start medication and therapy sessions. Does not want any benzodiazepine rx due to fear of dependence. No SI or hallucination, no ETOH use of illicit drug use.  Start cymbalta Entered psychology referral f/up in 64month

## 2020-06-12 NOTE — Patient Instructions (Signed)
You will be contacted to schedule an appt with therapist. Start cymbalta as discussed.  Managing Stress, Adult Feeling a certain amount of stress is normal. Stress helps our body and mind get ready to deal with the demands of life. Stress hormones can motivate you to do well at work and meet your responsibilities. However severe or long-lasting (chronic) stress can affect your mental and physical health. Chronic stress puts you at higher risk for anxiety, depression, and other health problems like digestive problems, muscle aches, heart disease, high blood pressure, and stroke. What are the causes? Common causes of stress include:  Demands from work, such as deadlines, feeling overworked, or having long hours.  Pressures at home, such as money issues, disagreements with a spouse, or parenting issues.  Pressures from major life changes, such as divorce, moving, loss of a loved one, or chronic illness. You may be at higher risk for stress-related problems if you do not get enough sleep, are in poor health, do not have emotional support, or have a mental health disorder like anxiety or depression. How to recognize stress Stress can make you:  Have trouble sleeping.  Feel sad, anxious, irritable, or overwhelmed.  Lose your appetite.  Overeat or want to eat unhealthy foods.  Want to use drugs or alcohol. Stress can also cause physical symptoms, such as:  Sore, tense muscles, especially in the shoulders and neck.  Headaches.  Trouble breathing.  A faster heart rate.  Stomach pain, nausea, or vomiting.  Diarrhea or constipation.  Trouble concentrating. Follow these instructions at home: Lifestyle  Identify the source of your stress and your reaction to it. See a therapist who can help you change your reactions.  When there are stressful events: ? Talk about it with family, friends, or co-workers. ? Try to think realistically about stressful events and not ignore them or  overreact. ? Try to find the positives in a stressful situation and not focus on the negatives. ? Cut back on responsibilities at work and home, if possible. Ask for help from friends or family members if you need it.  Find ways to cope with stress, such as: ? Meditation. ? Deep breathing. ? Yoga or tai chi. ? Progressive muscle relaxation. ? Doing art, playing music, or reading. ? Making time for fun activities. ? Spending time with family and friends.  Get support from family, friends, or spiritual resources. Eating and drinking  Eat a healthy diet. This includes: ? Eating foods that are high in fiber, such as beans, whole grains, and fresh fruits and vegetables. ? Limiting foods that are high in fat and processed sugars, such as fried and sweet foods.  Do not skip meals or overeat.  Drink enough fluid to keep your urine pale yellow. Alcohol use  Do not drink alcohol if: ? Your health care provider tells you not to drink. ? You are pregnant, may be pregnant, or are planning to become pregnant.  Drinking alcohol is a way some people try to ease their stress. This can be dangerous, so if you drink alcohol: ? Limit how much you use to:  0-1 drink a day for women.  0-2 drinks a day for men. ? Be aware of how much alcohol is in your drink. In the [place] one drink equals one 12 oz bottle of beer (355 mL), one 5 oz glass of wine (148 mL), or one 1 oz glass of hard liquor (44 mL). Activity  Include 30 minutes of exercise in your  daily schedule. Exercise is a good stress reducer.  Include time in your day for an activity that you find relaxing. Try taking a walk, going on a bike ride, reading a book, or listening to music.  Schedule your time in a way that lowers stress, and keep a consistent schedule. Prioritize what is most important to get done.   General instructions  Get enough sleep. Try to go to sleep and get up at about the same time every day.  Take over-the-counter  and prescription medicines only as told by your health care provider.  Do not use any products that contain nicotine or tobacco, such as cigarettes, e-cigarettes, and chewing tobacco. If you need help quitting, ask your health care provider.  Do not use drugs or smoke to cope with stress.  Keep all follow-up visits as told by your health care provider. This is important. Where to find support  Talk with your health care provider about stress management or finding a support group.  Find a therapist to work with you on your stress management techniques. Contact a health care provider if:  Your stress symptoms get worse.  You are unable to manage your stress at home.  You are struggling to stop using drugs or alcohol. Get help right away if:  You may be a danger to yourself or others.  You have any thoughts of death or suicide. If you ever feel like you may hurt yourself or others, or have thoughts about taking your own life, get help right away. You can go to your nearest emergency department or call:  Your local emergency services (911 in the [place]  A suicide crisis helpline, such as the [facility] at 816-325-3799. This is open 24 hours a day. Summary  Feeling a certain amount of stress is normal, but severe or long-lasting (chronic) stress can affect your mental and physical health.  Chronic stress can put you at higher risk for anxiety, depression, and other health problems like digestive problems, muscle aches, heart disease, high blood pressure, and stroke.  You may be at higher risk for stress-related problems if you do not get enough sleep, are in poor health, lack emotional support, or have a mental health disorder like anxiety or depression.  Identify the source of your stress and your reaction to it. Try talking about stressful events with family, friends, or co-workers, finding a coping method, or getting support from spiritual  resources.  If you need more help, talk with your health care provider about finding a support group or a mental health therapist. This information is not intended to replace advice given to you by your health care provider. Make sure you discuss any questions you have with your health care provider. Document Revised: 08/09/2018 Document Reviewed: 08/09/2018 Elsevier Patient Education  [place].

## 2020-08-27 ENCOUNTER — Encounter: Payer: Self-pay | Admitting: Nurse Practitioner

## 2020-08-27 ENCOUNTER — Ambulatory Visit (INDEPENDENT_AMBULATORY_CARE_PROVIDER_SITE_OTHER): Payer: BC Managed Care – PPO | Admitting: Nurse Practitioner

## 2020-08-27 ENCOUNTER — Other Ambulatory Visit: Payer: Self-pay

## 2020-08-27 VITALS — BP 100/64 | HR 74 | Temp 97.1°F | Ht 65.25 in | Wt 173.0 lb

## 2020-08-27 DIAGNOSIS — Z13220 Encounter for screening for lipoid disorders: Secondary | ICD-10-CM | POA: Diagnosis not present

## 2020-08-27 DIAGNOSIS — F411 Generalized anxiety disorder: Secondary | ICD-10-CM

## 2020-08-27 DIAGNOSIS — Z0000 Encounter for general adult medical examination without abnormal findings: Secondary | ICD-10-CM

## 2020-08-27 DIAGNOSIS — Z136 Encounter for screening for cardiovascular disorders: Secondary | ICD-10-CM

## 2020-08-27 LAB — COMPREHENSIVE METABOLIC PANEL
ALT: 15 U/L (ref 0–35)
AST: 17 U/L (ref 0–37)
Albumin: 4.3 g/dL (ref 3.5–5.2)
Alkaline Phosphatase: 38 U/L — ABNORMAL LOW (ref 39–117)
BUN: 10 mg/dL (ref 6–23)
CO2: 27 mEq/L (ref 19–32)
Calcium: 9.3 mg/dL (ref 8.4–10.5)
Chloride: 105 mEq/L (ref 96–112)
Creatinine, Ser: 0.83 mg/dL (ref 0.40–1.20)
GFR: 85.91 mL/min (ref >60.00)
Glucose, Bld: 82 mg/dL (ref 70–99)
Potassium: 4.2 mEq/L (ref 3.5–5.1)
Sodium: 141 mEq/L (ref 135–145)
Total Bilirubin: 0.4 mg/dL (ref 0.2–1.2)
Total Protein: 6.9 g/dL (ref 6.0–8.3)

## 2020-08-27 LAB — CBC
HCT: 40.4 % (ref 36.0–46.0)
Hemoglobin: 13.5 g/dL (ref 12.0–15.0)
MCHC: 33.5 g/dL (ref 30.0–36.0)
MCV: 89.7 fl (ref 78.0–100.0)
Platelets: 163 10*3/uL (ref 150.0–400.0)
RBC: 4.5 Mil/uL (ref 3.87–5.11)
RDW: 13.5 % (ref 11.5–15.5)
WBC: 6.2 10*3/uL (ref 4.0–10.5)

## 2020-08-27 LAB — LIPID PANEL
Cholesterol: 200 mg/dL (ref 0–200)
HDL: 61.2 mg/dL (ref >39.00)
LDL Cholesterol: 122 mg/dL — ABNORMAL HIGH (ref 0–99)
NonHDL: 139
Total CHOL/HDL Ratio: 3
Triglycerides: 87 mg/dL (ref 0.0–149.0)
VLDL: 17.4 mg/dL (ref 0.0–40.0)

## 2020-08-27 LAB — TSH: TSH: 0.85 u[IU]/mL (ref 0.35–5.50)

## 2020-08-27 MED ORDER — DULOXETINE HCL 20 MG PO CPEP: 20.0000 mg | ORAL_CAPSULE | Freq: Every day | ORAL | 3 refills | Status: DC

## 2020-08-27 NOTE — Progress Notes (Signed)
Subjective:    Patient ID: Janice Lyons, female    DOB: [birth year], 45 y.o.   MRN: [id number]  Patient presents today for CPE  HPI GAD (generalized anxiety disorder) Improved mood with cymbalta She is still to schedule appt with psychologist Denies any adverse side effects with current medication dose  Maintain current dose F/up in 45month  Vision:not needed Dental:up to date Diet:regular Exercise: Weight:  Wt Readings from Last 3 Encounters:  08/27/20 173 lb (78.5 kg)  06/12/20 177 lb 6.4 oz (80.5 kg)  09/06/19 168 lb (76.2 kg)    Sexual History (orientation,birth control, marital status, STD):pelvic and breast exam completed by GYN  Depression/Suicide[facility] 08/27/2020 06/12/2020 12/01/2018 05/31/2017 05/17/2017 07/28/2015  Decreased Interest 0 2 0 0 0 0  Down, Depressed, Hopeless 0 1 0 0 0 0  PHQ - 2 Score 0 3 0 0 0 0  Altered sleeping 0 1 - - - -  Tired, decreased energy 1 1 - - - -  Change in appetite 0 1 - - - -  Feeling bad or failure about yourself  0 0 - - - -  Trouble concentrating 0 3 - - - -  Moving slowly or fidgety/restless 0 0 - - - -  Suicidal thoughts 0 0 - - - -  PHQ-9 Score 1 9 - - - -  Difficult doing work/chores Not difficult at all Somewhat difficult - - - -   Immunizations: (TDAP, Hep C screen, Pneumovax, Influenza, zoster)  Health Maintenance  Topic Date Due   COVID-19 Vaccine (1) Never done   Pap Smear  04/03/2017   Flu Shot  08/25/2020   Hepatitis C Screening: USPSTF Recommendation to screen - Ages 18-79 yo.  08/27/2021*   HIV Screening  08/27/2021*   Colon Cancer Screening  06/18/2024   Tetanus Vaccine  11/19/2027   Pneumococcal Vaccination  Aged Out   HPV Vaccine  Aged Out  *Topic was postponed. The date shown is not the original due date.   Fall Risk: Fall Risk  08/27/2020 12/01/2018 05/31/2017 05/17/2017 07/28/2015  Falls in the past year? 0 0 No No No  Number falls in past yr: 0 - - - -  Injury with Fall? 0 - - - -   Risk for fall due to : No Fall Risks - - - -  Follow up Falls evaluation completed - - - -   Advanced Directive: Advanced Directives 12/10/2011  Does Patient Have a Medical Advance Directive? Patient does not have advance directive    Medications and allergies reviewed with patient and updated if appropriate.  Patient Active Problem List   Diagnosis Date Noted   GAD (generalized anxiety disorder) 06/12/2020   Polyp of ascending colon 12/01/2018   S/P laparoscopic cholecystectomy 12/01/2018   Cigarette nicotine dependence without complication [id number]  Abdominal distension 12/02/2015   Endometrioma of ovary 05/10/2014   IBS (irritable bowel syndrome) 07/05/2013   Gastroesophageal reflux disease 11/02/2011   H/O: hysterectomy 11/02/2011   Endometriosis 09/01/2011   Current Outpatient Medications on File Prior to Visit  Medication Sig Dispense Refill   BIOTIN PO Take by mouth daily.     cetirizine (ZYRTEC) 10 MG tablet Take 10 mg by mouth daily.     doxycycline (VIBRA-TABS) 100 MG tablet Take 1 tablet (100 mg total) by mouth 2 (two) times daily. 20 tablet 0   estradiol (ESTRACE) 1 MG tablet daily.     FIBER PO Take by mouth.  Magnesium 400 MG CAPS Take by mouth.     Multiple Vitamin (MULTI VITAMIN DAILY PO) Take by mouth.     omeprazole (PRILOSEC) 20 MG capsule Take 20 mg by mouth daily.     UNABLE TO FIND Vitamin D     No current facility-administered medications on file prior to visit.   Past Medical History:  Diagnosis Date   Abdominal distension    Abdominal pain    Allergy    Bruises easily    Chest pain    Chills    Chronic cholecystitis 0000000   Complication of anesthesia    severe facial itching   Constipation    Endometriosis    Gastritis    Gastritis 03/2012   Generalized headaches    GERD (gastroesophageal reflux disease)    Hyperplastic colon polyp    Interstitial cystitis    Microscopic colitis    Nasal congestion    Nausea    Osteopenia     Serrated adenoma of colon    TMJ (dislocation of temporomandibular joint)    Weakness    Yeast infection     Past Surgical History:  Procedure Laterality Date   ABDOMINAL HYSTERECTOMY     BILATERAL TEMPOROMANDIBULAR JOINT ARTHROPLASTY     BREAST SURGERY     COLONOSCOPY     DIAGNOSTIC LAPAROSCOPY     LAPAROSCOPIC LYSIS OF ADHESIONS  12/13/2011   Procedure: LAPAROSCOPIC LYSIS OF ADHESIONS;  Surgeon: Allena Katz, MD;  Location: [facility];  Service: Gynecology;  Laterality: N/A;   LAPAROSCOPY  12/13/2011   Procedure: LAPAROSCOPY OPERATIVE;  Surgeon: Allena Katz, MD;  Location: [facility];  Service: Gynecology;  Laterality: N/A;   LAPAROTOMY     OVARIAN CYST REMOVAL  12/13/2011   Procedure: OVARIAN CYSTECTOMY;  Surgeon: Allena Katz, MD;  Location: [facility];  Service: Gynecology;  Laterality: Right;   VAGINAL DELIVERY  1998   WISDOM TOOTH EXTRACTION      Social History   Socioeconomic History   Marital status: Married    Spouse name: Not on file   Number of children: 1   Years of education: Not on file   Highest education level: Not on file  Occupational History    Employer: [employer]  Tobacco Use   Smoking status: Former    Types: Cigarettes    Quit date: 11/01/1993    Years since quitting: 26.8   Smokeless tobacco: Never  Vaping Use   Vaping Use: Never used  Substance and Sexual Activity   Alcohol use: Not Currently    Alcohol/week: 0.0 standard drinks    Comment: rarely   Drug use: No   Sexual activity: Not on file  Other Topics Concern   Not on file  Social History Narrative   Not on file   Social Determinants of Health   Financial Resource Strain: Not on file  Food Insecurity: Not on file  Transportation Needs: Not on file  Physical Activity: Not on file  Stress: Not on file  Social Connections: Not on file   Family History  Problem Relation Age of Onset   Diabetes Maternal Grandfather    Leukemia Maternal Grandfather    Cancer Maternal  Grandfather        leukemia   Thyroid cancer Father    Stroke Father    Cancer Father    Heart disease Father    Hypertension Mother    Diabetes Mother    Cancer Paternal  Grandmother        does not what type   Colon cancer Neg Hx    Esophageal cancer Neg Hx    Stomach cancer Neg Hx    Rectal cancer Neg Hx        Review of Systems  Constitutional:  Negative for fever, malaise/fatigue and weight loss.  HENT:  Negative for congestion and sore throat.   Eyes:        Negative for visual changes  Respiratory:  Negative for cough and shortness of breath.   Cardiovascular:  Negative for chest pain, palpitations and leg swelling.  Gastrointestinal:  Negative for blood in stool, constipation, diarrhea and heartburn.  Genitourinary:  Negative for dysuria, frequency and urgency.  Musculoskeletal:  Negative for falls, joint pain and myalgias.  Skin:  Negative for rash.  Neurological:  Negative for dizziness, sensory change and headaches.  Endo/Heme/Allergies:  Does not bruise/bleed easily.  Psychiatric/Behavioral:  Negative for depression, substance abuse and suicidal ideas. The patient is not nervous/anxious.    Objective:   Vitals:   08/27/20 1014  BP: 100/64  Pulse: 74  Temp: (!) 97.1 F (36.2 C)  SpO2: 99%   Body mass index is 28.57 kg/m.  Physical Examination:  Physical Exam Vitals reviewed.  Constitutional:      General: She is not in acute distress.    Appearance: She is well-developed.  HENT:     Right Ear: Tympanic membrane, ear canal and external ear normal.     Left Ear: Tympanic membrane, ear canal and external ear normal.     Mouth/Throat:     Pharynx: No oropharyngeal exudate.  Eyes:     Conjunctiva/sclera: Conjunctivae normal.     Pupils: Pupils are equal, round, and reactive to light.  Cardiovascular:     Rate and Rhythm: Normal rate and regular rhythm.     Pulses: Normal pulses.     Heart sounds: Normal heart sounds.  Pulmonary:     Effort:  Pulmonary effort is normal. No respiratory distress.     Breath sounds: Normal breath sounds.  Chest:     Chest wall: No tenderness.  Genitourinary:    Comments: Deferred breast and pelvic exam to gyn Musculoskeletal:        General: Normal range of motion.     Cervical back: Normal range of motion and neck supple.     Right lower leg: No edema.     Left lower leg: No edema.  Lymphadenopathy:     Cervical: No cervical adenopathy.  Skin:    General: Skin is warm and dry.  Neurological:     Mental Status: She is alert and oriented to person, place, and time.     Deep Tendon Reflexes: Reflexes are normal and symmetric.  Psychiatric:        Mood and Affect: Mood normal.        Behavior: Behavior normal.        Thought Content: Thought content normal.    ASSESSMENT and PLAN: This visit occurred during the SARS-CoV-2 public health emergency.  Safety protocols were in place, including screening questions prior to the visit, additional usage of staff PPE, and extensive cleaning of exam room while observing appropriate contact time as indicated for disinfecting solutions.   Shimika was seen today for annual exam.  Diagnoses and all orders for this visit:  Preventative health care -     CBC -     Comprehensive metabolic panel -  TSH -     Lipid panel  Encounter for lipid screening for cardiovascular disease -     Lipid panel  GAD (generalized anxiety disorder) -     DULoxetine (CYMBALTA) 20 MG capsule; Take 1 capsule (20 mg total) by mouth daily.  Sign medical release to get PAP and mammogram report from GYN.    Problem List Items Addressed This Visit       Other   GAD (generalized anxiety disorder)    Improved mood with cymbalta She is still to schedule appt with psychologist Denies any adverse side effects with current medication dose  Maintain current dose F/up in 54month       Relevant Medications   DULoxetine (CYMBALTA) 20 MG capsule   Other Visit  Diagnoses     Preventative health care    -  Primary   Relevant Orders   CBC   Comprehensive metabolic panel   TSH   Lipid panel   Encounter for lipid screening for cardiovascular disease       Relevant Orders   Lipid panel       Follow up: Return in about 3 months (around 11/27/2020) for Anxiety (video or in person).  CWilfred Lacy NP

## 2020-08-27 NOTE — Patient Instructions (Signed)
Maintain current medication dose  Go to lab for blood draw  Sign medical release to get PAP and mammogram report from GYN.  Preventive Care 44-44 Years Old, Female Preventive care refers to lifestyle choices and visits with your health care provider that can promote health and wellness. This includes: A yearly physical exam. This is also called an annual wellness visit. Regular dental and eye exams. Immunizations. Screening for certain conditions. Healthy lifestyle choices, such as: Eating a healthy diet. Getting regular exercise. Not using drugs or products that contain nicotine and tobacco. Limiting alcohol use. What can I expect for my preventive care visit? Physical exam Your health care provider will check your: Height and weight. These may be used to calculate your BMI (body mass index). BMI is a measurement that tells if you are at a healthy weight. Heart rate and blood pressure. Body temperature. Skin for abnormal spots. Counseling Your health care provider may ask you questions about your: Past medical problems. Family's medical history. Alcohol, tobacco, and drug use. Emotional well-being. Home life and relationship well-being. Sexual activity. Diet, exercise, and sleep habits. Work and work Statistician. Access to firearms. Method of birth control. Menstrual cycle. Pregnancy history. What immunizations do I need?  Vaccines are usually given at various ages, according to a schedule. Your health care provider will recommend vaccines for you based on your age, medicalhistory, and lifestyle or other factors, such as travel or where you work. What tests do I need? Blood tests Lipid and cholesterol levels. These may be checked every 5 years, or more often if you are over 23 years old. Hepatitis C test. Hepatitis B test. Screening Lung cancer screening. You may have this screening every year starting at age 14 if you have a 30-pack-year history of smoking and  currently smoke or have quit within the past 15 years. Colorectal cancer screening. All adults should have this screening starting at age 19 and continuing until age 29. Your health care provider may recommend screening at age 64 if you are at increased risk. You will have tests every 1-10 years, depending on your results and the type of screening test. Diabetes screening. This is done by checking your blood sugar (glucose) after you have not eaten for a while (fasting). You may have this done every 1-3 years. Mammogram. This may be done every 1-2 years. Talk with your health care provider about when you should start having regular mammograms. This may depend on whether you have a family history of breast cancer. BRCA-related cancer screening. This may be done if you have a family history of breast, ovarian, tubal, or peritoneal cancers. Pelvic exam and Pap test. This may be done every 3 years starting at age 35. Starting at age 41, this may be done every 5 years if you have a Pap test in combination with an HPV test. Other tests STD (sexually transmitted disease) testing, if you are at risk. Bone density scan. This is done to screen for osteoporosis. You may have this scan if you are at high risk for osteoporosis. Talk with your health care provider about your test results, treatment options,and if necessary, the need for more tests. Follow these instructions at home: Eating and drinking  Eat a diet that includes fresh fruits and vegetables, whole grains, lean protein, and low-fat dairy products. Take vitamin and mineral supplements as recommended by your health care provider. Do not drink alcohol if: Your health care provider tells you not to drink. You are pregnant, may  be pregnant, or are planning to become pregnant. If you drink alcohol: Limit how much you have to 0-1 drink a day. Be aware of how much alcohol is in your drink. In the [place] one drink equals one 12 oz bottle of beer  (355 mL), one 5 oz glass of wine (148 mL), or one 1 oz glass of hard liquor (44 mL).  Lifestyle Take daily care of your teeth and gums. Brush your teeth every morning and night with fluoride toothpaste. Floss one time each day. Stay active. Exercise for at least 30 minutes 5 or more days each week. Do not use any products that contain nicotine or tobacco, such as cigarettes, e-cigarettes, and chewing tobacco. If you need help quitting, ask your health care provider. Do not use drugs. If you are sexually active, practice safe sex. Use a condom or other form of protection to prevent STIs (sexually transmitted infections). If you do not wish to become pregnant, use a form of birth control. If you plan to become pregnant, see your health care provider for a prepregnancy visit. If told by your health care provider, take low-dose aspirin daily starting at age 58. Find healthy ways to cope with stress, such as: Meditation, yoga, or listening to music. Journaling. Talking to a trusted person. Spending time with friends and family. Safety Always wear your seat belt while driving or riding in a vehicle. Do not drive: If you have been drinking alcohol. Do not ride with someone who has been drinking. When you are tired or distracted. While texting. Wear a helmet and other protective equipment during sports activities. If you have firearms in your house, make sure you follow all gun safety procedures. What's next? Visit your health care provider once a year for an annual wellness visit. Ask your health care provider how often you should have your eyes and teeth checked. Stay up to date on all vaccines. This information is not intended to replace advice given to you by your health care provider. Make sure you discuss any questions you have with your healthcare provider. Document Revised: 10/16/2019 Document Reviewed: 09/22/2017 Elsevier Patient Education  2022 [employer].

## 2020-08-27 NOTE — Assessment & Plan Note (Signed)
Improved mood with cymbalta She is still to schedule appt with psychologist Denies any adverse side effects with current medication dose  Maintain current dose F/up in 39month

## 2020-09-15 ENCOUNTER — Encounter: Payer: Self-pay | Admitting: Nurse Practitioner

## 2020-09-15 DIAGNOSIS — M8580 Other specified disorders of bone density and structure, unspecified site: Secondary | ICD-10-CM | POA: Insufficient documentation

## 2020-11-27 ENCOUNTER — Telehealth: Payer: BC Managed Care – PPO | Admitting: Nurse Practitioner

## 2021-01-01 ENCOUNTER — Telehealth: Payer: BC Managed Care – PPO | Admitting: Physician Assistant

## 2021-01-01 DIAGNOSIS — U071 COVID-19: Secondary | ICD-10-CM

## 2021-01-01 NOTE — Progress Notes (Signed)
If you desire the FDA-emergency use antiviral medication options, we do require a video visit for that. To access a video visit please select the "Virtual Urgent Care Visit" option in the [employer].   If you convert to a video visit, we will bill your insurance (similar to an office visit) and you will not be charged for this e-Visit. You will be able to stay at home and speak with the first available [facility] advanced practice provider. The link to do a video visit is in the drop down Menu tab of your Welcome screen in [place].

## 2021-01-09 ENCOUNTER — Encounter: Payer: Self-pay | Admitting: Nurse Practitioner

## 2021-01-09 ENCOUNTER — Telehealth: Payer: BC Managed Care – PPO | Admitting: Nurse Practitioner

## 2021-01-09 DIAGNOSIS — F411 Generalized anxiety disorder: Secondary | ICD-10-CM

## 2021-01-09 NOTE — Assessment & Plan Note (Signed)
Stable mood with cymbalta 20mg  Maintain med dose F/up in 6-55months of sooner if needed

## 2021-01-09 NOTE — Progress Notes (Signed)
Virtual Visit via Video Note  I connected withNAME@ on 01/09/21 at 10:00 AM EST by a video enabled telemedicine application and verified that I am speaking with the correct person using two identifiers.  Location: Patient:Home Provider: Office Participants: patient and provider  I discussed the limitations of evaluation and management by telemedicine and the availability of in person appointments. I also discussed with the patient that there may be a patient responsible charge related to this service. The patient expressed understanding and agreed to proceed.  BS:[id number] f/up  History of Present Illness: GAD (generalized anxiety disorder) Stable mood with cymbalta 20mg  Maintain med dose F/up in 6-28months of sooner if needed  Depression screen [facility] 2/9 01/09/2021 08/27/2020 06/12/2020  Decreased Interest 0 0 2  Down, Depressed, Hopeless 0 0 1  PHQ - 2 Score 0 0 3  Altered sleeping 0 0 1  Tired, decreased energy 0 1 1  Change in appetite 0 0 1  Feeling bad or failure about yourself  0 0 0  Trouble concentrating 0 0 3  Moving slowly or fidgety/restless 0 0 0  Suicidal thoughts 0 0 0  PHQ-9 Score 0 1 9  Difficult doing work/chores - Not difficult at all Somewhat difficult    GAD 7 : Generalized Anxiety Score 01/09/2021 08/27/2020 06/12/2020  Nervous, Anxious, on Edge 0 0 3  Control/stop worrying 0 0 3  Worry too much - different things 1 1 3   Trouble relaxing 0 0 3  Restless 0 0 3  Easily annoyed or irritable 0 0 3  Afraid - awful might happen 0 1 3  Total GAD 7 Score 1 2 21   Anxiety Difficulty Not difficult at all Not difficult at all Very difficult   Observations/Objective: Alert and oriented x4 Normal mood and behavior  Assessment and Plan: Oaklie was seen today for follow-up.  Diagnoses and all orders for this visit:  GAD (generalized anxiety disorder)  Follow Up Instructions: See above   I discussed the assessment and treatment plan with the patient. The patient  was provided an opportunity to ask questions and all were answered. The patient agreed with the plan and demonstrated an understanding of the instructions.   The patient was advised to call back or seek an in-person evaluation if the symptoms worsen or if the condition fails to improve as anticipated.  Wilfred Lacy, NP

## 2021-01-13 ENCOUNTER — Other Ambulatory Visit: Payer: Self-pay | Admitting: Nurse Practitioner

## 2021-01-13 DIAGNOSIS — F411 Generalized anxiety disorder: Secondary | ICD-10-CM

## 2021-01-14 ENCOUNTER — Telehealth: Payer: Self-pay | Admitting: Nurse Practitioner

## 2021-01-14 NOTE — Telephone Encounter (Signed)
Rx filled on 08/27/20 with a quantity of 90 (taken daily), with 3 refills on file.

## 2021-01-16 NOTE — Telephone Encounter (Signed)
Attempted to contact pt to find out what was not approved, no answer lvm to call back.

## 2021-03-06 ENCOUNTER — Ambulatory Visit: Payer: BC Managed Care – PPO | Admitting: Nurse Practitioner

## 2021-06-10 DIAGNOSIS — Z90721 Acquired absence of ovaries, unilateral: Secondary | ICD-10-CM | POA: Insufficient documentation

## 2021-06-10 LAB — HM MAMMOGRAPHY

## 2021-06-11 ENCOUNTER — Other Ambulatory Visit: Payer: Self-pay | Admitting: Obstetrics and Gynecology

## 2021-06-11 DIAGNOSIS — M79622 Pain in left upper arm: Secondary | ICD-10-CM

## 2021-06-15 LAB — HM DEXA SCAN

## 2021-06-17 ENCOUNTER — Other Ambulatory Visit: Payer: BC Managed Care – PPO

## 2021-08-27 ENCOUNTER — Encounter: Payer: BC Managed Care – PPO | Admitting: Nurse Practitioner

## 2021-08-28 ENCOUNTER — Ambulatory Visit (INDEPENDENT_AMBULATORY_CARE_PROVIDER_SITE_OTHER): Payer: BC Managed Care – PPO | Admitting: Nurse Practitioner

## 2021-08-28 ENCOUNTER — Encounter: Payer: Self-pay | Admitting: Nurse Practitioner

## 2021-08-28 VITALS — BP 116/60 | HR 69 | Temp 96.9°F | Ht 65.0 in | Wt 177.2 lb

## 2021-08-28 DIAGNOSIS — Z13220 Encounter for screening for lipoid disorders: Secondary | ICD-10-CM | POA: Diagnosis not present

## 2021-08-28 DIAGNOSIS — Z0000 Encounter for general adult medical examination without abnormal findings: Secondary | ICD-10-CM | POA: Diagnosis not present

## 2021-08-28 DIAGNOSIS — Z136 Encounter for screening for cardiovascular disorders: Secondary | ICD-10-CM | POA: Diagnosis not present

## 2021-08-28 DIAGNOSIS — K5904 Chronic idiopathic constipation: Secondary | ICD-10-CM

## 2021-08-28 DIAGNOSIS — Z9049 Acquired absence of other specified parts of digestive tract: Secondary | ICD-10-CM | POA: Insufficient documentation

## 2021-08-28 DIAGNOSIS — F411 Generalized anxiety disorder: Secondary | ICD-10-CM

## 2021-08-28 LAB — LIPID PANEL
Cholesterol: 212 mg/dL — ABNORMAL HIGH (ref 0–200)
HDL: 58.2 mg/dL (ref >39.00)
LDL Cholesterol: 130 mg/dL — ABNORMAL HIGH (ref 0–99)
NonHDL: 154.14
Total CHOL/HDL Ratio: 4
Triglycerides: 119 mg/dL (ref 0.0–149.0)
VLDL: 23.8 mg/dL (ref 0.0–40.0)

## 2021-08-28 LAB — COMPREHENSIVE METABOLIC PANEL
ALT: 20 U/L (ref 0–35)
AST: 21 U/L (ref 0–37)
Albumin: 4.1 g/dL (ref 3.5–5.2)
Alkaline Phosphatase: 42 U/L (ref 39–117)
BUN: 7 mg/dL (ref 6–23)
CO2: 29 mEq/L (ref 19–32)
Calcium: 9.2 mg/dL (ref 8.4–10.5)
Chloride: 104 mEq/L (ref 96–112)
Creatinine, Ser: 0.96 mg/dL (ref 0.40–1.20)
GFR: 71.64 mL/min (ref >60.00)
Glucose, Bld: 89 mg/dL (ref 70–99)
Potassium: 4 mEq/L (ref 3.5–5.1)
Sodium: 137 mEq/L (ref 135–145)
Total Bilirubin: 0.3 mg/dL (ref 0.2–1.2)
Total Protein: 7 g/dL (ref 6.0–8.3)

## 2021-08-28 MED ORDER — DULOXETINE HCL 20 MG PO CPEP: 20.0000 mg | ORAL_CAPSULE | Freq: Every day | ORAL | 3 refills | Status: DC

## 2021-08-28 NOTE — Assessment & Plan Note (Addendum)
Improved with cymbalta, diet modification and regular exercise. No blood in stool Last colonoscopy 2021: repeat in 42yr

## 2021-08-28 NOTE — Progress Notes (Signed)
Complete physical exam  Patient: Janice Lyons   DOB: 09/24/1975   46 y.o. Female  MRN: [id number] Visit Date: 08/28/2021  Subjective:    Chief Complaint  Patient presents with   Annual Exam    CPE Pt fasting  No concerns     Janice Lyons is a 46 y.o. female who presents today for a complete physical exam. She reports consuming a general diet. Home exercise routine includes walking and weight training. She generally feels fairly well. She reports sleeping fairly well. She does not have additional problems to discuss today.  Vision:No Dental:Yes STD Screen:No  Wt Readings from Last 3 Encounters:  08/28/21 177 lb 3.2 oz (80.4 kg)  01/09/21 173 lb (78.5 kg)  08/27/20 173 lb (78.5 kg)    Most recent fall risk assessment:    08/28/2021    8:41 AM  [place] in the past year? 0  Number falls in past yr: 0  Injury with Fall? 0     Most recent depression screenings:    01/09/2021    9:30 AM 08/27/2020   10:53 AM  PHQ 2/9 Scores  PHQ - 2 Score 0 0  PHQ- 9 Score 0 1    HPI  GAD (generalized anxiety disorder) Stable with cymbalta Refill sent  Constipation Improved with cymbalta, diet modification and regular exercise. No blood in stool Last colonoscopy 2021: repeat in 64yr   Past Medical History:  Diagnosis Date   Abdominal distension    Abdominal pain    Allergy    Bruises easily    Chest pain    Chills    Chronic cholecystitis 30/1749  Complication of anesthesia    severe facial itching   Constipation    Endometriosis    Gastritis    Gastritis 03/2012   Generalized headaches    GERD (gastroesophageal reflux disease)    Hyperplastic colon polyp    Interstitial cystitis    Microscopic colitis    Nasal congestion    Nausea    Osteopenia    Serrated adenoma of colon    TMJ (dislocation of temporomandibular joint)    Weakness    Yeast infection    Past Surgical History:  Procedure Laterality Date   ABDOMINAL HYSTERECTOMY     BILATERAL  TEMPOROMANDIBULAR JOINT ARTHROPLASTY     BREAST SURGERY     COLONOSCOPY     DIAGNOSTIC LAPAROSCOPY     LAPAROSCOPIC LYSIS OF ADHESIONS  12/13/2011   Procedure: LAPAROSCOPIC LYSIS OF ADHESIONS;  Surgeon: JAllena Katz MD;  [facility];  Service: Gynecology;  Laterality: N/A;   LAPAROSCOPY  12/13/2011   Procedure: LAPAROSCOPY OPERATIVE;  Surgeon: JAllena Katz MD;  [facility];  Service: Gynecology;  Laterality: N/A;   LAPAROTOMY     OVARIAN CYST REMOVAL  12/13/2011   Procedure: OVARIAN CYSTECTOMY;  Surgeon: JAllena Katz MD;  [facility];  Service: Gynecology;  Laterality: Right;   VAGINAL DELIVERY  1998   WISDOM TOOTH EXTRACTION     Social History   Socioeconomic History   Marital status: Married    Spouse name: Not on file   Number of children: 1   Years of education: Not on file   Highest education level: Not on file  Occupational History    Employer: [employer]  Tobacco Use   Smoking status: Former    Types: Cigarettes    Quit date: 11/01/1993  Years since quitting: 27.8   Smokeless tobacco: Never  Vaping Use   Vaping Use: Never used  Substance and Sexual Activity   Alcohol use: Not Currently    Alcohol/week: 0.0 standard drinks of alcohol    Comment: rarely   Drug use: No   Sexual activity: Not Currently  Other Topics Concern   Not on file  Social History Narrative   Not on file   Social Determinants of Health   Financial Resource Strain: Not on file  Food Insecurity: Not on file  Transportation Needs: Not on file  Physical Activity: Not on file  Stress: Not on file  Social Connections: Not on file  Intimate Partner Violence: Not on file   Family Status  Relation Name Status   MGF  Deceased   Father  Alive   Mother  Alive   [place]  Deceased   Neg Hx  (Not Specified)   Family History  Problem Relation Age of Onset   Diabetes Maternal Grandfather    Leukemia Maternal Grandfather    Cancer Maternal Grandfather         leukemia   Thyroid cancer Father    Stroke Father    Cancer Father    Heart disease Father    Hypertension Mother    Diabetes Mother    Cancer Paternal Grandmother        does not what type   Colon cancer Neg Hx    Esophageal cancer Neg Hx    Stomach cancer Neg Hx    Rectal cancer Neg Hx    Allergies  Allergen Reactions   Ketorolac Itching   Oxycodone Itching, Hives and Nausea And Vomiting    Facial itching   Tilactase    Milk (Cow) Nausea Only   Milk Protein Diarrhea   Tape Rash    Glue used during surgery    Uribel [Meth-Hyo-M Bl-Na Phos-Ph Sal] Itching, Nausea Only and Palpitations    Patient Care Team: Jennifermarie Franzen, Charlene Brooke, NP as PCP - General (Internal Medicine)   Medications: Outpatient Medications Prior to Visit  Medication Sig   BIOTIN PO Take by mouth daily.   estradiol (ESTRACE) 1 MG tablet daily.   FIBER PO Take by mouth.   Multiple Vitamin (MULTI VITAMIN DAILY PO) Take by mouth.   omeprazole (PRILOSEC) 20 MG capsule Take 20 mg by mouth daily.   [DISCONTINUED] DULoxetine (CYMBALTA) 20 MG capsule Take 1 capsule (20 mg total) by mouth daily.   No facility-administered medications prior to visit.    Review of Systems      Objective:  BP 116/60 (BP Location: Right Arm, Patient Position: Sitting, Cuff Size: Small)   Pulse 69   Temp (!) 96.9 F (36.1 C) (Temporal)   Ht '5\' 5"'$  (1.651 m)   Wt 177 lb 3.2 oz (80.4 kg)   SpO2 99%   BMI 29.49 kg/m       Physical Exam Constitutional:      General: She is not in acute distress. HENT:     Right Ear: Tympanic membrane, ear canal and external ear normal.     Left Ear: Tympanic membrane, ear canal and external ear normal.     Nose: Nose normal.  Eyes:     General: No scleral icterus.    Extraocular Movements: Extraocular movements intact.     Conjunctiva/sclera: Conjunctivae normal.  Cardiovascular:     Rate and Rhythm: Normal rate and regular rhythm.     Heart sounds: Normal heart sounds.  Pulmonary:      Effort: Pulmonary effort is normal. No respiratory distress.     Breath sounds: Normal breath sounds.  Abdominal:     General: Bowel sounds are normal. There is no distension.     Palpations: Abdomen is soft.  Genitourinary:    Comments: Breast and pelvic exam deferred to GYN Musculoskeletal:        General: Normal range of motion.     Cervical back: Normal range of motion and neck supple.     Right lower leg: No edema.     Left lower leg: No edema.  Lymphadenopathy:     Cervical: No cervical adenopathy.  Skin:    General: Skin is warm and dry.  Neurological:     Mental Status: She is alert and oriented to person, place, and time.  Psychiatric:        Mood and Affect: Mood normal.        Behavior: Behavior normal.        Thought Content: Thought content normal.     No results found for any visits on 08/28/21.    Assessment & Plan:    Routine Health Maintenance and Physical Exam  Immunization History  Administered Date(s) Administered   Influenza Inj Mdck Quad Pf 11/14/2017, 11/09/2018   Influenza, Seasonal, Injecte, Preservative Fre 10/26/2015   Influenza-Unspecified 10/26/2015, 10/27/2018   Pneumococcal Polysaccharide-23 03/12/2016   Tdap 11/18/2017    Health Maintenance  Topic Date Due   INFLUENZA VACCINE  08/25/2021   COVID-19 Vaccine (1) 09/13/2021 (Originally 01/10/1976)   Hepatitis C Screening  08/29/2022 (Originally 07/12/1994)   HIV Screening  08/29/2022 (Originally 07/12/1991)   PAP SMEAR-Modifier  04/18/2023   COLONOSCOPY (Pts 45-42yr Insurance coverage will need to be confirmed)  06/18/2024   TETANUS/TDAP  11/19/2027   HPV VACCINES  Aged Out   Discussed health benefits of physical activity, and encouraged her to engage in regular exercise appropriate for her age and condition.  Problem List Items Addressed This Visit       Other   Constipation    Improved with cymbalta, diet modification and regular exercise. No blood in stool Last colonoscopy  2021: repeat in 543yr     GAD (generalized anxiety disorder)    Stable with cymbalta Refill sent      Relevant Medications   DULoxetine (CYMBALTA) 20 MG capsule   Other Visit Diagnoses     Preventative health care    -  Primary   Relevant Orders   Comprehensive metabolic panel   Lipid panel   Encounter for lipid screening for cardiovascular disease       Relevant Orders   Lipid panel      Return in about 1 year (around 08/29/2022) for CPE and GAD (fasting).     ChWilfred LacyNP

## 2021-08-28 NOTE — Patient Instructions (Signed)
Go to lab Maintain heart healthy diet and regular exercise Sign medical release form to get records from Dr. Gertie Fey  Preventive Care 45-45 Years Old, Female Preventive care refers to lifestyle choices and visits with your health care provider that can promote health and wellness. Preventive care visits are also called wellness exams. What can I expect for my preventive care visit? Counseling Your health care provider may ask you questions about your: Medical history, including: Past medical problems. Family medical history. Pregnancy history. Current health, including: Menstrual cycle. Method of birth control. Emotional well-being. Home life and relationship well-being. Sexual activity and sexual health. Lifestyle, including: Alcohol, nicotine or tobacco, and drug use. Access to firearms. Diet, exercise, and sleep habits. Work and work Statistician. Sunscreen use. Safety issues such as seatbelt and bike helmet use. Physical exam Your health care provider will check your: Height and weight. These may be used to calculate your BMI (body mass index). BMI is a measurement that tells if you are at a healthy weight. Waist circumference. This measures the distance around your waistline. This measurement also tells if you are at a healthy weight and may help predict your risk of certain diseases, such as type 2 diabetes and high blood pressure. Heart rate and blood pressure. Body temperature. Skin for abnormal spots. What immunizations do I need?  Vaccines are usually given at various ages, according to a schedule. Your health care provider will recommend vaccines for you based on your age, medical history, and lifestyle or other factors, such as travel or where you work. What tests do I need? Screening Your health care provider may recommend screening tests for certain conditions. This may include: Lipid and cholesterol levels. Diabetes screening. This is done by checking your blood  sugar (glucose) after you have not eaten for a while (fasting). Pelvic exam and Pap test. Hepatitis B test. Hepatitis C test. HIV (human immunodeficiency virus) test. STI (sexually transmitted infection) testing, if you are at risk. Lung cancer screening. Colorectal cancer screening. Mammogram. Talk with your health care provider about when you should start having regular mammograms. This may depend on whether you have a family history of breast cancer. BRCA-related cancer screening. This may be done if you have a family history of breast, ovarian, tubal, or peritoneal cancers. Bone density scan. This is done to screen for osteoporosis. Talk with your health care provider about your test results, treatment options, and if necessary, the need for more tests. Follow these instructions at home: Eating and drinking  Eat a diet that includes fresh fruits and vegetables, whole grains, lean protein, and low-fat dairy products. Take vitamin and mineral supplements as recommended by your health care provider. Do not drink alcohol if: Your health care provider tells you not to drink. You are pregnant, may be pregnant, or are planning to become pregnant. If you drink alcohol: Limit how much you have to 0-1 drink a day. Know how much alcohol is in your drink. In the [place] one drink equals one 12 oz bottle of beer (355 mL), one 5 oz glass of wine (148 mL), or one 1 oz glass of hard liquor (44 mL). Lifestyle Brush your teeth every morning and night with fluoride toothpaste. Floss one time each day. Exercise for at least 30 minutes 5 or more days each week. Do not use any products that contain nicotine or tobacco. These products include cigarettes, chewing tobacco, and vaping devices, such as e-cigarettes. If you need help quitting, ask your health care provider.  Do not use drugs. If you are sexually active, practice safe sex. Use a condom or other form of protection to prevent STIs. If you do not  wish to become pregnant, use a form of birth control. If you plan to become pregnant, see your health care provider for a prepregnancy visit. Take aspirin only as told by your health care provider. Make sure that you understand how much to take and what form to take. Work with your health care provider to find out whether it is safe and beneficial for you to take aspirin daily. Find healthy ways to manage stress, such as: Meditation, yoga, or listening to music. Journaling. Talking to a trusted person. Spending time with friends and family. Minimize exposure to UV radiation to reduce your risk of skin cancer. Safety Always wear your seat belt while driving or riding in a vehicle. Do not drive: If you have been drinking alcohol. Do not ride with someone who has been drinking. When you are tired or distracted. While texting. If you have been using any mind-altering substances or drugs. Wear a helmet and other protective equipment during sports activities. If you have firearms in your house, make sure you follow all gun safety procedures. Seek help if you have been physically or sexually abused. What's next? Visit your health care provider once a year for an annual wellness visit. Ask your health care provider how often you should have your eyes and teeth checked. Stay up to date on all vaccines. This information is not intended to replace advice given to you by your health care provider. Make sure you discuss any questions you have with your health care provider. Document Revised: 07/09/2020 Document Reviewed: 07/09/2020 Elsevier Patient Education  [place].

## 2021-08-28 NOTE — Assessment & Plan Note (Signed)
Stable with cymbalta Refill sent

## 2021-09-17 ENCOUNTER — Other Ambulatory Visit: Payer: Self-pay | Admitting: Nurse Practitioner

## 2021-09-17 DIAGNOSIS — F411 Generalized anxiety disorder: Secondary | ICD-10-CM

## 2021-09-17 NOTE — Telephone Encounter (Signed)
Chart supports Rx Last OV: 08/2021 Next OV: 08/2022

## 2021-11-01 ENCOUNTER — Encounter: Payer: Self-pay | Admitting: Nurse Practitioner

## 2022-06-16 LAB — HM MAMMOGRAPHY

## 2022-06-30 ENCOUNTER — Other Ambulatory Visit: Payer: Self-pay | Admitting: Nurse Practitioner

## 2022-06-30 DIAGNOSIS — F411 Generalized anxiety disorder: Secondary | ICD-10-CM

## 2022-09-01 ENCOUNTER — Ambulatory Visit (INDEPENDENT_AMBULATORY_CARE_PROVIDER_SITE_OTHER): Payer: BC Managed Care – PPO | Admitting: Nurse Practitioner

## 2022-09-01 ENCOUNTER — Encounter: Payer: Self-pay | Admitting: Nurse Practitioner

## 2022-09-01 VITALS — BP 122/84 | HR 96 | Temp 98.1°F | Resp 16 | Ht 65.0 in | Wt 184.4 lb

## 2022-09-01 DIAGNOSIS — Z0000 Encounter for general adult medical examination without abnormal findings: Secondary | ICD-10-CM | POA: Diagnosis not present

## 2022-09-01 DIAGNOSIS — M85852 Other specified disorders of bone density and structure, left thigh: Secondary | ICD-10-CM

## 2022-09-01 DIAGNOSIS — Z0001 Encounter for general adult medical examination with abnormal findings: Secondary | ICD-10-CM

## 2022-09-01 DIAGNOSIS — E6609 Other obesity due to excess calories: Secondary | ICD-10-CM

## 2022-09-01 DIAGNOSIS — E7800 Pure hypercholesterolemia, unspecified: Secondary | ICD-10-CM | POA: Insufficient documentation

## 2022-09-01 DIAGNOSIS — Z23 Encounter for immunization: Secondary | ICD-10-CM

## 2022-09-01 DIAGNOSIS — F411 Generalized anxiety disorder: Secondary | ICD-10-CM | POA: Diagnosis not present

## 2022-09-01 DIAGNOSIS — F17209 Nicotine dependence, unspecified, with unspecified nicotine-induced disorders: Secondary | ICD-10-CM

## 2022-09-01 DIAGNOSIS — Z6830 Body mass index (BMI) 30.0-30.9, adult: Secondary | ICD-10-CM

## 2022-09-01 LAB — LIPID PANEL
Cholesterol: 219 mg/dL — ABNORMAL HIGH (ref 0–200)
HDL: 62.1 mg/dL (ref >39.00)
LDL Cholesterol: 132 mg/dL — ABNORMAL HIGH (ref 0–99)
NonHDL: 156.87
Total CHOL/HDL Ratio: 4
Triglycerides: 123 mg/dL (ref 0.0–149.0)
VLDL: 24.6 mg/dL (ref 0.0–40.0)

## 2022-09-01 LAB — COMPREHENSIVE METABOLIC PANEL
ALT: 20 U/L (ref 0–35)
AST: 20 U/L (ref 0–37)
Albumin: 4.2 g/dL (ref 3.5–5.2)
Alkaline Phosphatase: 38 U/L — ABNORMAL LOW (ref 39–117)
BUN: 8 mg/dL (ref 6–23)
CO2: 27 mEq/L (ref 19–32)
Calcium: 9.4 mg/dL (ref 8.4–10.5)
Chloride: 105 mEq/L (ref 96–112)
Creatinine, Ser: 0.89 mg/dL (ref 0.40–1.20)
GFR: 77.9 mL/min (ref >60.00)
Glucose, Bld: 91 mg/dL (ref 70–99)
Potassium: 3.9 mEq/L (ref 3.5–5.1)
Sodium: 140 mEq/L (ref 135–145)
Total Bilirubin: 0.4 mg/dL (ref 0.2–1.2)
Total Protein: 6.7 g/dL (ref 6.0–8.3)

## 2022-09-01 MED ORDER — DULOXETINE HCL 20 MG PO CPEP
20.0000 mg | ORAL_CAPSULE | Freq: Every day | ORAL | 3 refills | Status: DC
Start: 2022-09-01 — End: 2023-04-07

## 2022-09-01 NOTE — Assessment & Plan Note (Addendum)
Repeat lipid panel Encouraged to quit tobacco use and maintain heart healthy diet

## 2022-09-01 NOTE — Assessment & Plan Note (Addendum)
Dexa scan 2023: osteopenia femoral neck. Secondary to oophorectomy Current use of calcium and vit. D Need to repeat dexa scan 2026

## 2022-09-01 NOTE — Assessment & Plan Note (Addendum)
Difficulty with weight loss due to responsibilities home and work. She has Limited time for self care thus tends to eat high calorie meals and limited exercise. Anxeity is stable with use of cymbalta Unable to schedule appointment with therapist due to schedule conflict Wt Readings from Last 3 Encounters:  09/01/22 184 lb 6.4 oz (83.6 kg)  08/28/21 177 lb 3.2 oz (80.4 kg)  01/09/21 173 lb (78.5 kg)    She agreed to healthy weight management clinic referral

## 2022-09-01 NOTE — Progress Notes (Signed)
Complete physical exam  Patient: Janice Lyons   DOB: 1975/09/08   47 y.o. Female  MRN: [id number] Visit Date: 09/01/2022  Subjective:    Chief Complaint  Patient presents with   Annual Exam    Fasting    Janice Lyons is a 47 y.o. female who presents today for a complete physical exam. She reports consuming a general diet.  No exercise regimen  She generally feels fairly well. She reports sleeping fairly well. She does have additional problems to discuss today.  Vision:No Dental:Yes STD Screen:No  Care of terminally ill husband, multiple tragic events in family  BP Readings from Last 3 Encounters:  09/01/22 122/84  08/28/21 116/60  08/27/20 100/64   Wt Readings from Last 3 Encounters:  09/01/22 184 lb 6.4 oz (83.6 kg)  08/28/21 177 lb 3.2 oz (80.4 kg)  01/09/21 173 lb (78.5 kg)    Most recent fall risk assessment:    09/01/2022    9:17 AM  Fall Risk   Falls in the past year? 0  Number falls in past yr: 0  Injury with Fall? 0  Risk for fall due to : No Fall Risks  Follow up Falls evaluation completed     Depression screen:Yes - Depression Most recent depression screenings:    09/01/2022    9:19 AM 08/28/2021    9:23 AM  PHQ 2/9 Scores  PHQ - 2 Score 1 0  PHQ- 9 Score 5 1    HPI  Tobacco use disorder, continuous Smokes 30cig per week Trigger: stress States she know she need to quit due to adverse effects, but has no plan or quit date at this time.  Osteopenia Dexa scan 2023: osteopenia femoral neck. Secondary to oophorectomy Current use of calcium and vit. D Need to repeat dexa scan 2026  GAD (generalized anxiety disorder) Stable with cymbalta Refill sent  Class 1 obesity due to excess calories with serious comorbidity and body mass index (BMI) of 30.0 to 30.9 in adult Difficulty with weight loss due to responsibilities home and work. She has Limited time for self care thus tends to eat high calorie meals and limited exercise. Anxeity is stable  with use of cymbalta Unable to schedule appointment with therapist due to schedule conflict Wt Readings from Last 3 Encounters:  09/01/22 184 lb 6.4 oz (83.6 kg)  08/28/21 177 lb 3.2 oz (80.4 kg)  01/09/21 173 lb (78.5 kg)    She agreed to healthy weight management clinic referral   Hypercholesterolemia Repeat lipid panel Encouraged to quit tobacco use and maintain heart healthy diet   Past Medical History:  Diagnosis Date   Abdominal distension    Abdominal pain    Allergy    Anxiety    Bruises easily    Chest pain    Chills    Chronic cholecystitis 03/2012   Complication of anesthesia    severe facial itching   Constipation    Endometriosis    Gastritis    Gastritis 03/2012   Generalized headaches    GERD (gastroesophageal reflux disease)    Hyperplastic colon polyp    Interstitial cystitis    Microscopic colitis    Nasal congestion    Nausea    Osteopenia    Serrated adenoma of colon    TMJ (dislocation of temporomandibular joint)    Weakness    Yeast infection    Past Surgical History:  Procedure Laterality Date   ABDOMINAL HYSTERECTOMY     BILATERAL  TEMPOROMANDIBULAR JOINT ARTHROPLASTY     BREAST SURGERY     CHOLECYSTECTOMY     COLONOSCOPY     DIAGNOSTIC LAPAROSCOPY     LAPAROSCOPIC LYSIS OF ADHESIONS  12/13/2011   Procedure: LAPAROSCOPIC LYSIS OF ADHESIONS;  Surgeon: Leslie Andrea, MD;  Location: [facility];  Service: Gynecology;  Laterality: N/A;   LAPAROSCOPY  12/13/2011   Procedure: LAPAROSCOPY OPERATIVE;  Surgeon: Leslie Andrea, MD;  Location: [facility];  Service: Gynecology;  Laterality: N/A;   LAPAROTOMY     OVARIAN CYST REMOVAL  12/13/2011   Procedure: OVARIAN CYSTECTOMY;  Surgeon: Leslie Andrea, MD;  Location: [facility];  Service: Gynecology;  Laterality: Right;   VAGINAL DELIVERY  1998   WISDOM TOOTH EXTRACTION     Social History   Socioeconomic History   Marital status: Married    Spouse name: Not on file   Number of children: 1    Years of education: Not on file   Highest education level: Not on file  Occupational History    Employer: [employer]  Tobacco Use   Smoking status: Former    Current packs/day: 0.25    Average packs/day: 0.3 packs/day for 39.6 years (9.9 ttl pk-yrs)    Types: Cigarettes    Start date: 01/26/1983   Smokeless tobacco: Never   Tobacco comments:    I smoke here and there usually depends on stress  Vaping Use   Vaping status: Never Used  Substance and Sexual Activity   Alcohol use: Not Currently    Comment: rarely   Drug use: No   Sexual activity: Not Currently    Birth control/protection: None    Comment: Hysterectomy  Other Topics Concern   Not on file  Social History Narrative   Not on file   Social Determinants of Health   Financial Resource Strain: Not on file  Food Insecurity: Not on file  Transportation Needs: Not on file  Physical Activity: Not on file  Stress: Not on file  Social Connections: Unknown (05/30/2021)   Received from [facility], [facility]   Social Network    Social Network: Not on file  Intimate Partner Violence: Unknown (04/27/2021)   Received from [facility], [facility]   HITS    Physically Hurt: Not on file    Insult or Talk Down To: Not on file    Threaten Physical Harm: Not on file    Scream or Curse: Not on file   Family Status  Relation Name Status   Mother Claudell Kyle Alive   Father Caryn Bee Alive   Daughter Bonney Roussel (Not Specified)   Mat Uncle  Deceased   MGM  Alive   MGF Adela Lank Deceased   PGM Maude Deceased   Neg Hx  (Not Specified)  No partnership data on file   Family History  Problem Relation Age of Onset   Hypertension Mother    Diabetes Mother    Obesity Mother    Thyroid cancer Father    Stroke Father    Cancer Father    Heart disease Father    Miscarriages / [place] Daughter    Cancer Maternal Uncle 63       non hodgkin and colon cancer   Dementia Maternal Grandmother    Diabetes Maternal Grandfather     Leukemia Maternal Grandfather    Cancer Maternal Grandfather        leukemia   Cancer Paternal Grandmother  does not what type   Colon cancer Neg Hx    Esophageal cancer Neg Hx    Stomach cancer Neg Hx    Rectal cancer Neg Hx    Allergies  Allergen Reactions   Ketorolac Itching   Oxycodone Itching, Hives and Nausea And Vomiting    Facial itching   Tilactase    Milk (Cow) Nausea Only   Milk Protein Diarrhea   Tape Rash    Glue used during surgery    Uribel [Meth-Hyo-M Bl-Na Phos-Ph Sal] Itching, Nausea Only and Palpitations    Patient Care Team: Lacy Sofia, Bonna Gains, NP as PCP - General (Internal Medicine)   Medications: Outpatient Medications Prior to Visit  Medication Sig   estradiol (ESTRACE) 1 MG tablet daily.   FIBER PO Take by mouth.   Multiple Vitamin (MULTI VITAMIN DAILY PO) Take by mouth.   omeprazole (PRILOSEC) 20 MG capsule Take 20 mg by mouth daily.   [DISCONTINUED] BIOTIN PO Take by mouth daily.   [DISCONTINUED] DULoxetine (CYMBALTA) 20 MG capsule TAKE 1 CAPSULE BY MOUTH EVERY DAY   No facility-administered medications prior to visit.    Review of Systems  Constitutional:  Negative for activity change, appetite change and unexpected weight change.  Respiratory: Negative.    Cardiovascular: Negative.   Gastrointestinal: Negative.   Endocrine: Negative for cold intolerance and heat intolerance.  Genitourinary: Negative.   Musculoskeletal: Negative.   Skin: Negative.   Neurological: Negative.   Hematological: Negative.   Psychiatric/Behavioral:  Negative for behavioral problems, decreased concentration, dysphoric mood, hallucinations, self-injury, sleep disturbance and suicidal ideas. The patient is nervous/anxious.        Objective:  BP 122/84 (BP Location: Left Arm, Patient Position: Sitting, Cuff Size: Large)   Pulse 96   Temp 98.1 F (36.7 C) (Temporal)   Resp 16   Ht 5\' 5"  (1.651 m)   Wt 184 lb 6.4 oz (83.6 kg)   SpO2 96%   BMI  30.69 kg/m     Physical Exam Vitals and nursing note reviewed.  Constitutional:      General: She is not in acute distress. HENT:     Right Ear: Tympanic membrane, ear canal and external ear normal.     Left Ear: Tympanic membrane, ear canal and external ear normal.     Nose: Nose normal.  Eyes:     Extraocular Movements: Extraocular movements intact.     Conjunctiva/sclera: Conjunctivae normal.     Pupils: Pupils are equal, round, and reactive to light.  Neck:     Thyroid: No thyroid mass, thyromegaly or thyroid tenderness.  Cardiovascular:     Rate and Rhythm: Normal rate and regular rhythm.     Pulses: Normal pulses.     Heart sounds: Normal heart sounds.  Pulmonary:     Effort: Pulmonary effort is normal.     Breath sounds: Normal breath sounds.  Abdominal:     General: Bowel sounds are normal.     Palpations: Abdomen is soft.  Musculoskeletal:        General: Normal range of motion.     Cervical back: Normal range of motion and neck supple.     Right lower leg: No edema.     Left lower leg: No edema.  Lymphadenopathy:     Cervical: No cervical adenopathy.  Skin:    General: Skin is warm and dry.  Neurological:     Mental Status: She is alert and oriented to person, place, and time.  Cranial Nerves: No cranial nerve deficit.  Psychiatric:        Mood and Affect: Mood normal.        Behavior: Behavior normal.        Thought Content: Thought content normal.     No results found for any visits on 09/01/22.    Assessment & Plan:    Routine Health Maintenance and Physical Exam  Immunization History  Administered Date(s) Administered   Influenza Inj Mdck Quad Pf 11/14/2017, 11/09/2018   Influenza, Seasonal, Injecte, Preservative Fre 10/26/2015   Influenza-Unspecified 10/26/2015, 10/27/2018   Pneumococcal Polysaccharide-23 03/12/2016   Tdap 11/18/2017   Health Maintenance  Topic Date Due   COVID-19 Vaccine (1 - 2023-24 season) 10/18/2022 (Originally  09/25/2021)   INFLUENZA VACCINE  04/25/2023 (Originally 08/26/2022)   Hepatitis C Screening  09/01/2023 (Originally 07/12/1994)   HIV Screening  09/01/2023 (Originally 07/12/1991)   PAP SMEAR-Modifier  04/18/2023   Colonoscopy  06/18/2024   DTaP/Tdap/Td (2 - Td or Tdap) 11/19/2027   HPV VACCINES  Aged Out   Discussed health benefits of physical activity, and encouraged her to engage in regular exercise appropriate for her age and condition.  Problem List Items Addressed This Visit       Musculoskeletal and Integument   Osteopenia    Dexa scan 2023: osteopenia femoral neck. Secondary to oophorectomy Current use of calcium and vit. D Need to repeat dexa scan 2026        Other   Class 1 obesity due to excess calories with serious comorbidity and body mass index (BMI) of 30.0 to 30.9 in adult    Difficulty with weight loss due to responsibilities home and work. She has Limited time for self care thus tends to eat high calorie meals and limited exercise. Anxeity is stable with use of cymbalta Unable to schedule appointment with therapist due to schedule conflict Wt Readings from Last 3 Encounters:  09/01/22 184 lb 6.4 oz (83.6 kg)  08/28/21 177 lb 3.2 oz (80.4 kg)  01/09/21 173 lb (78.5 kg)    She agreed to healthy weight management clinic referral       Relevant Orders   Amb Ref to Medical Weight Management   GAD (generalized anxiety disorder)    Stable with cymbalta Refill sent      Relevant Medications   DULoxetine (CYMBALTA) 20 MG capsule   Hypercholesterolemia    Repeat lipid panel Encouraged to quit tobacco use and maintain heart healthy diet      Relevant Orders   Lipid panel   Amb Ref to Medical Weight Management   Tobacco use disorder, continuous    Smokes 30cig per week Trigger: stress States she know she need to quit due to adverse effects, but has no plan or quit date at this time.      Other Visit Diagnoses     Encounter for preventative adult health  care exam with abnormal findings    -  Primary   Relevant Orders   Comprehensive metabolic panel   Need for immunization against influenza          Return in about 6 months (around 03/04/2023) for depression and anxiety.     Alysia Penna, NP

## 2022-09-01 NOTE — Assessment & Plan Note (Signed)
Stable with cymbalta Refill sent 

## 2022-09-01 NOTE — Patient Instructions (Signed)
Go to lab Continue Heart healthy diet and daily exercise. Maintain current medications.  Preventive Care 46-46 Years Old, Female Preventive care refers to lifestyle choices and visits with your health care provider that can promote health and wellness. Preventive care visits are also called wellness exams. What can I expect for my preventive care visit? Counseling Your health care provider may ask you questions about your: Medical history, including: Past medical problems. Family medical history. Pregnancy history. Current health, including: Menstrual cycle. Method of birth control. Emotional well-being. Home life and relationship well-being. Sexual activity and sexual health. Lifestyle, including: Alcohol, nicotine or tobacco, and drug use. Access to firearms. Diet, exercise, and sleep habits. Work and work Astronomer. Sunscreen use. Safety issues such as seatbelt and bike helmet use. Physical exam Your health care provider will check your: Height and weight. These may be used to calculate your BMI (body mass index). BMI is a measurement that tells if you are at a healthy weight. Waist circumference. This measures the distance around your waistline. This measurement also tells if you are at a healthy weight and may help predict your risk of certain diseases, such as type 2 diabetes and high blood pressure. Heart rate and blood pressure. Body temperature. Skin for abnormal spots. What immunizations do I need?  Vaccines are usually given at various ages, according to a schedule. Your health care provider will recommend vaccines for you based on your age, medical history, and lifestyle or other factors, such as travel or where you work. What tests do I need? Screening Your health care provider may recommend screening tests for certain conditions. This may include: Lipid and cholesterol levels. Diabetes screening. This is done by checking your blood sugar (glucose) after you  have not eaten for a while (fasting). Pelvic exam and Pap test. Hepatitis B test. Hepatitis C test. HIV (human immunodeficiency virus) test. STI (sexually transmitted infection) testing, if you are at risk. Lung cancer screening. Colorectal cancer screening. Mammogram. Talk with your health care provider about when you should start having regular mammograms. This may depend on whether you have a family history of breast cancer. BRCA-related cancer screening. This may be done if you have a family history of breast, ovarian, tubal, or peritoneal cancers. Bone density scan. This is done to screen for osteoporosis. Talk with your health care provider about your test results, treatment options, and if necessary, the need for more tests. Follow these instructions at home: Eating and drinking  Eat a diet that includes fresh fruits and vegetables, whole grains, lean protein, and low-fat dairy products. Take vitamin and mineral supplements as recommended by your health care provider. Do not drink alcohol if: Your health care provider tells you not to drink. You are pregnant, may be pregnant, or are planning to become pregnant. If you drink alcohol: Limit how much you have to 0-1 drink a day. Know how much alcohol is in your drink. In the [place] one drink equals one 12 oz bottle of beer (355 mL), one 5 oz glass of wine (148 mL), or one 1 oz glass of hard liquor (44 mL). Lifestyle Brush your teeth every morning and night with fluoride toothpaste. Floss one time each day. Exercise for at least 30 minutes 5 or more days each week. Do not use any products that contain nicotine or tobacco. These products include cigarettes, chewing tobacco, and vaping devices, such as e-cigarettes. If you need help quitting, ask your health care provider. Do not use drugs. If you are  sexually active, practice safe sex. Use a condom or other form of protection to prevent STIs. If you do not wish to become pregnant, use  a form of birth control. If you plan to become pregnant, see your health care provider for a prepregnancy visit. Take aspirin only as told by your health care provider. Make sure that you understand how much to take and what form to take. Work with your health care provider to find out whether it is safe and beneficial for you to take aspirin daily. Find healthy ways to manage stress, such as: Meditation, yoga, or listening to music. Journaling. Talking to a trusted person. Spending time with friends and family. Minimize exposure to UV radiation to reduce your risk of skin cancer. Safety Always wear your seat belt while driving or riding in a vehicle. Do not drive: If you have been drinking alcohol. Do not ride with someone who has been drinking. When you are tired or distracted. While texting. If you have been using any mind-altering substances or drugs. Wear a helmet and other protective equipment during sports activities. If you have firearms in your house, make sure you follow all gun safety procedures. Seek help if you have been physically or sexually abused. What's next? Visit your health care provider once a year for an annual wellness visit. Ask your health care provider how often you should have your eyes and teeth checked. Stay up to date on all vaccines. This information is not intended to replace advice given to you by your health care provider. Make sure you discuss any questions you have with your health care provider. Document Revised: 07/09/2020 Document Reviewed: 07/09/2020 Elsevier Patient Education  2024 [employer].

## 2022-09-01 NOTE — Assessment & Plan Note (Addendum)
Smokes 30cig per week Trigger: stress States she know she need to quit due to adverse effects, but has no plan or quit date at this time.

## 2022-09-07 ENCOUNTER — Encounter: Payer: Self-pay | Admitting: Nurse Practitioner

## 2023-03-04 ENCOUNTER — Ambulatory Visit: Payer: BC Managed Care – PPO | Admitting: Nurse Practitioner

## 2023-03-07 ENCOUNTER — Ambulatory Visit: Payer: BC Managed Care – PPO | Admitting: Nurse Practitioner

## 2023-04-07 ENCOUNTER — Encounter: Payer: Self-pay | Admitting: Nurse Practitioner

## 2023-04-07 ENCOUNTER — Ambulatory Visit (INDEPENDENT_AMBULATORY_CARE_PROVIDER_SITE_OTHER): Payer: BC Managed Care – PPO | Admitting: Nurse Practitioner

## 2023-04-07 VITALS — BP 122/74 | HR 68 | Temp 97.3°F | Ht 65.0 in | Wt 180.8 lb

## 2023-04-07 DIAGNOSIS — F4321 Adjustment disorder with depressed mood: Secondary | ICD-10-CM

## 2023-04-07 DIAGNOSIS — F411 Generalized anxiety disorder: Secondary | ICD-10-CM

## 2023-04-07 DIAGNOSIS — E7800 Pure hypercholesterolemia, unspecified: Secondary | ICD-10-CM

## 2023-04-07 MED ORDER — DULOXETINE HCL 20 MG PO CPEP
20.0000 mg | ORAL_CAPSULE | Freq: Every day | ORAL | 3 refills | Status: DC
Start: 2023-04-07 — End: 2023-10-03

## 2023-04-07 NOTE — Progress Notes (Signed)
 Established Patient Visit  Patient: Janice Lyons   DOB: [birth year]   47 y.o. Female  MRN: [id number] Visit Date: 04/07/2023  Subjective:    Chief Complaint  Patient presents with   Follow-up    6 month f/u    HPI Hypercholesterolemia Maintain med dose, encouraged to maintain a mediterranean diet and daily exercise Will repeat lipid panel in 5months  GAD (generalized anxiety disorder) Stable mood despite recent death of husband. Agreed to grief counseling referral Maintain cymbalta dose     04/07/2023    9:34 AM 09/01/2022    9:19 AM 08/28/2021    9:23 AM  Depression screen PHQ 2/9  Decreased Interest 0 1 0  Down, Depressed, Hopeless 0 0 0  PHQ - 2 Score 0 1 0  Altered sleeping 3 0 1  Tired, decreased energy 0 2 0  Change in appetite 0 2 0  Feeling bad or failure about yourself  0 0 0  Trouble concentrating 0 0 0  Moving slowly or fidgety/restless 0 0 0  Suicidal thoughts 0 0 0  PHQ-9 Score 3 5 1   Difficult doing work/chores Not difficult at all Not difficult at all Not difficult at all       04/07/2023    9:37 AM 09/01/2022    9:20 AM 08/28/2021    9:23 AM 01/09/2021    9:31 AM  GAD 7 : Generalized Anxiety Score  Nervous, Anxious, on Edge 2 1 1  0  Control/stop worrying 1 1 0 0  Worry too much - different things 2 0 0 1  Trouble relaxing 2 2 0 0  Restless 1 2 0 0  Easily annoyed or irritable 0 0 0 0  Afraid - awful might happen 0 0 0 0  Total GAD 7 Score 8 6 1 1   Anxiety Difficulty Not difficult at all Not difficult at all Not difficult at all Not difficult at all   Reviewed medical, surgical, and social history today  Medications: Outpatient Medications Prior to Visit  Medication Sig   estradiol (ESTRACE) 1 MG tablet daily.   FIBER PO Take by mouth.   Multiple Vitamin (MULTI VITAMIN DAILY PO) Take by mouth.   omeprazole (PRILOSEC) 20 MG capsule Take 20 mg by mouth daily.   [DISCONTINUED] DULoxetine (CYMBALTA) 20 MG capsule Take 1 capsule (20  mg total) by mouth daily.   No facility-administered medications prior to visit.   Reviewed past medical and social history.   ROS per HPI above      Objective:  BP 122/74 (BP Location: Left Arm, Patient Position: Sitting, Cuff Size: Normal)   Pulse 68   Temp (!) 97.3 F (36.3 C) (Temporal)   Ht 5\' 5"  (1.651 m)   Wt 180 lb 12.8 oz (82 kg)   SpO2 98%   BMI 30.09 kg/m      Physical Exam Vitals and nursing note reviewed.  Cardiovascular:     Rate and Rhythm: Normal rate.     Pulses: Normal pulses.  Pulmonary:     Effort: Pulmonary effort is normal.  Neurological:     Mental Status: She is alert and oriented to person, place, and time.  Psychiatric:        Mood and Affect: Mood normal.        Behavior: Behavior normal.        Thought Content: Thought content normal.     No  results found for any visits on 04/07/23.    Assessment & Plan:    Problem List Items Addressed This Visit     GAD (generalized anxiety disorder)   Stable mood despite recent death of husband. Agreed to grief counseling referral Maintain cymbalta dose      Relevant Medications   DULoxetine (CYMBALTA) 20 MG capsule   Hypercholesterolemia - Primary   Maintain med dose, encouraged to maintain a mediterranean diet and daily exercise Will repeat lipid panel in 5months      Relevant Orders   Ambulatory referral to Psychology   Other Visit Diagnoses       Grief          Return in about 5 months (around 09/07/2023) for CPE (fasting).     Alysia Penna, NP

## 2023-04-07 NOTE — Assessment & Plan Note (Addendum)
 Stable mood despite recent death of husband. Agreed to grief counseling referral Maintain cymbalta dose

## 2023-04-07 NOTE — Assessment & Plan Note (Addendum)
 Maintain med dose, encouraged to maintain a mediterranean diet and daily exercise Will repeat lipid panel in 5months

## 2023-09-06 ENCOUNTER — Other Ambulatory Visit: Payer: Self-pay | Admitting: Medical Genetics

## 2023-09-07 ENCOUNTER — Ambulatory Visit: Admitting: Nurse Practitioner

## 2023-09-07 ENCOUNTER — Encounter: Payer: Self-pay | Admitting: Nurse Practitioner

## 2023-09-07 VITALS — BP 116/70 | HR 79 | Temp 98.2°F | Ht 65.0 in | Wt 172.0 lb

## 2023-09-07 DIAGNOSIS — Z0001 Encounter for general adult medical examination with abnormal findings: Secondary | ICD-10-CM | POA: Diagnosis not present

## 2023-09-07 DIAGNOSIS — E7800 Pure hypercholesterolemia, unspecified: Secondary | ICD-10-CM | POA: Diagnosis not present

## 2023-09-07 DIAGNOSIS — F17209 Nicotine dependence, unspecified, with unspecified nicotine-induced disorders: Secondary | ICD-10-CM | POA: Diagnosis not present

## 2023-09-07 LAB — COMPREHENSIVE METABOLIC PANEL WITH GFR
ALT: 16 U/L (ref 0–35)
AST: 19 U/L (ref 0–37)
Albumin: 4.2 g/dL (ref 3.5–5.2)
Alkaline Phosphatase: 35 U/L — ABNORMAL LOW (ref 39–117)
BUN: 11 mg/dL (ref 6–23)
CO2: 28 meq/L (ref 19–32)
Calcium: 9.3 mg/dL (ref 8.4–10.5)
Chloride: 104 meq/L (ref 96–112)
Creatinine, Ser: 1.01 mg/dL (ref 0.40–1.20)
GFR: 66.46 mL/min (ref >60.00)
Glucose, Bld: 85 mg/dL (ref 70–99)
Potassium: 3.9 meq/L (ref 3.5–5.1)
Sodium: 140 meq/L (ref 135–145)
Total Bilirubin: 0.4 mg/dL (ref 0.2–1.2)
Total Protein: 7 g/dL (ref 6.0–8.3)

## 2023-09-07 LAB — LIPID PANEL
Cholesterol: 206 mg/dL — ABNORMAL HIGH (ref 0–200)
HDL: 58.9 mg/dL (ref >39.00)
LDL Cholesterol: 127 mg/dL — ABNORMAL HIGH (ref 0–99)
NonHDL: 147.27
Total CHOL/HDL Ratio: 4
Triglycerides: 99 mg/dL (ref 0.0–149.0)
VLDL: 19.8 mg/dL (ref 0.0–40.0)

## 2023-09-07 LAB — TSH: TSH: 1.22 u[IU]/mL (ref 0.35–5.50)

## 2023-09-07 NOTE — Assessment & Plan Note (Signed)
 Smokes 1pack of cig per week Trigger: stress States she know she need to quit due to adverse effects, but has no plan or quit date at this time.

## 2023-09-07 NOTE — Progress Notes (Signed)
 Complete physical exam  Patient: Janice Lyons   DOB: Feb 10, 1975   48 y.o. Female  MRN: [id number] Visit Date: 09/07/2023  Subjective:    Chief Complaint  Patient presents with   Follow-up   Annual Exam    FASTING    Nashae A Perrault is a 48 y.o. female who presents today for a complete physical exam. She reports consuming a general diet. Walking daily She generally feels well. She reports sleeping well. She does not have additional problems to discuss today.  Vision:No Dental:Yes STD Screen:No  BP Readings from Last 3 Encounters:  09/07/23 116/70  04/07/23 122/74  09/01/22 122/84   Wt Readings from Last 3 Encounters:  09/07/23 172 lb (78 kg)  04/07/23 180 lb 12.8 oz (82 kg)  09/01/22 184 lb 6.4 oz (83.6 kg)    Most recent fall risk assessment:    09/07/2023    8:43 AM  Fall Risk   Falls in the past year? 0  Injury with Fall? 0  Risk for fall due to : No Fall Risks  Follow up Falls evaluation completed   Depression screen:Yes - No Depression Most recent depression screenings:    09/07/2023    8:43 AM 04/07/2023    9:34 AM  PHQ 2/9 Scores  PHQ - 2 Score 0 0  PHQ- 9 Score 4 3   HPI  Hypercholesterolemia encouraged to maintain a mediterranean diet and daily exercise Encouraged to quit tobacco use repeat lipid panel  Tobacco use disorder, continuous Smokes 1pack of cig per week Trigger: stress States she know she need to quit due to adverse effects, but has no plan or quit date at this time.  Past Medical History:  Diagnosis Date   Abdominal distension    Abdominal pain    Allergy    Anxiety    Bruises easily    Chest pain    Chills    Chronic cholecystitis 03/2012   Complication of anesthesia    severe facial itching   Constipation    Endometriosis    Gastritis    Gastritis 03/2012   Generalized headaches    GERD (gastroesophageal reflux disease)    Hyperplastic colon polyp    Interstitial cystitis    Microscopic colitis    Nasal  congestion    Nausea    Osteopenia    Serrated adenoma of colon    TMJ (dislocation of temporomandibular joint)    Weakness    Yeast infection    Past Surgical History:  Procedure Laterality Date   ABDOMINAL HYSTERECTOMY     BILATERAL TEMPOROMANDIBULAR JOINT ARTHROPLASTY     BREAST SURGERY     CHOLECYSTECTOMY     COLONOSCOPY     DIAGNOSTIC LAPAROSCOPY     LAPAROSCOPIC LYSIS OF ADHESIONS  12/13/2011   Procedure: LAPAROSCOPIC LYSIS OF ADHESIONS;  Surgeon: Lynwood FORBES Curlene PONCE, MD;  Location: [facility];  Service: Gynecology;  Laterality: N/A;   LAPAROSCOPY  12/13/2011   Procedure: LAPAROSCOPY OPERATIVE;  Surgeon: Lynwood FORBES Curlene PONCE, MD;  Location: [facility];  Service: Gynecology;  Laterality: N/A;   LAPAROTOMY     OVARIAN CYST REMOVAL  12/13/2011   Procedure: OVARIAN CYSTECTOMY;  Surgeon: Lynwood FORBES Curlene PONCE, MD;  Location: [facility];  Service: Gynecology;  Laterality: Right;   VAGINAL DELIVERY  1998   WISDOM TOOTH EXTRACTION     Social History   Socioeconomic History   Marital status: Widowed    Spouse name: Not on file  Number of children: 1   Years of education: Not on file   Highest education level: Associate degree: academic program  Occupational History    Employer: [employer]  Tobacco Use   Smoking status: Some Days    Current packs/day: 0.25    Average packs/day: 0.3 packs/day for 40.6 years (10.2 ttl pk-yrs)    Types: Cigarettes    Start date: 01/26/1983   Smokeless tobacco: Never   Tobacco comments:    I smoke here and there usually depends on stress  Vaping Use   Vaping status: Never Used  Substance and Sexual Activity   Alcohol use: Not Currently    Comment: rarely   Drug use: No   Sexual activity: Not Currently    Birth control/protection: Abstinence    Comment: Hysterectomy  Other Topics Concern   Not on file  Social History Narrative   Not on file   Social Drivers of Health   Financial Resource Strain: Medium Risk (09/05/2023)   Overall Financial Resource  Strain (CARDIA)    Difficulty of Paying Living Expenses: Somewhat hard  Food Insecurity: No Food Insecurity (09/05/2023)   Hunger Vital Sign    Worried About Running Out of Food in the Last Year: Never true    Ran Out of Food in the Last Year: Never true  Transportation Needs: No Transportation Needs (09/05/2023)   PRAPARE - Administrator, Civil Service (Medical): No    Lack of Transportation (Non-Medical): No  Physical Activity: Insufficiently Active (09/05/2023)   Exercise Vital Sign    Days of Exercise per Week: 4 days    Minutes of Exercise per Session: 30 min  Stress: Stress Concern Present (09/05/2023)   [employer] of Occupational Health - Occupational Stress Questionnaire    Feeling of Stress: To some extent  Social Connections: Moderately Integrated (09/05/2023)   Social Connection and Isolation Panel    Frequency of Communication with Friends and Family: More than three times a week    Frequency of Social Gatherings with Friends and Family: Twice a week    Attends Religious Services: More than 4 times per year    Active Member of [employer] or Organizations: Yes    Attends Banker Meetings: 1 to 4 times per year    Marital Status: Widowed  Intimate Partner Violence: Unknown (04/27/2021)   Received from [facility]   HITS    Physically Hurt: Not on file    Insult or Talk Down To: Not on file    Threaten Physical Harm: Not on file    Scream or Curse: Not on file   Family Status  Relation Name Status   Mother Erline Alive   Father Franky Searles   Daughter Nevelyn Lot (Not Specified)   Mat Uncle Vince Burroughs Deceased   [employer] [place] Alive   MGF [place] Deceased   [employer] [place] Deceased   Mat Aunt Darice Alive   Neg Hx  (Not Specified)  No partnership data on file   Family History  Problem Relation Age of Onset   Hypertension Mother    Diabetes Mother    Obesity Mother    Thyroid  cancer Father    Stroke Father    Cancer Father    Heart  disease Father    Miscarriages / Stillbirths Daughter    Cancer Maternal Uncle 71       non hodgkin and colon cancer   Dementia Maternal Grandmother    Arthritis Maternal Grandmother  Diabetes Maternal Grandfather    Leukemia Maternal Grandfather    Cancer Maternal Grandfather        leukemia   Cancer Paternal Grandmother        does not what type   Varicose Veins Maternal Aunt    Colon cancer Neg Hx    Esophageal cancer Neg Hx    Stomach cancer Neg Hx    Rectal cancer Neg Hx    Allergies  Allergen Reactions   Ketorolac  Itching   Oxycodone Itching, Hives and Nausea And Vomiting    Facial itching   Tilactase    Milk (Cow) Nausea Only   Milk Protein Diarrhea   Tape Rash    Glue used during surgery    Uribel [Meth-Hyo-M Bl-Na Phos-Ph Sal] Itching, Nausea Only and Palpitations    Patient Care Team: Kaidan Harpster, Roselie Rockford, NP as PCP - General (Internal Medicine) Curlene Agent, MD as Consulting Physician (Obstetrics and Gynecology)   Medications: Outpatient Medications Prior to Visit  Medication Sig   CALCIUM    DULoxetine  (CYMBALTA ) 20 MG capsule Take 1 capsule (20 mg total) by mouth daily.   estradiol (ESTRACE) 1 MG tablet daily.   Multiple Vitamin (MULTI VITAMIN DAILY PO) Take by mouth.   omeprazole (PRILOSEC) 20 MG capsule Take 20 mg by mouth daily.   Vitamin D-Vitamin K (D3 + K2) 125-100 MCG CAPS    [DISCONTINUED] FIBER PO Take by mouth.   No facility-administered medications prior to visit.    Review of Systems  Constitutional:  Negative for activity change, appetite change and unexpected weight change.  Respiratory: Negative.    Cardiovascular: Negative.   Gastrointestinal: Negative.   Endocrine: Negative for cold intolerance and heat intolerance.  Genitourinary: Negative.   Musculoskeletal: Negative.   Skin: Negative.   Neurological: Negative.   Hematological: Negative.   Psychiatric/Behavioral:  Negative for behavioral problems, decreased concentration,  dysphoric mood, hallucinations, self-injury, sleep disturbance and suicidal ideas. The patient is not nervous/anxious.         Objective:  BP 116/70 (BP Location: Left Arm, Patient Position: Sitting, Cuff Size: Large)   Pulse 79   Temp 98.2 F (36.8 C) (Oral)   Ht 5' 5 (1.651 m)   Wt 172 lb (78 kg)   SpO2 97%   BMI 28.62 kg/m     Physical Exam Vitals and nursing note reviewed.  Constitutional:      General: She is not in acute distress. HENT:     Right Ear: Tympanic membrane, ear canal and external ear normal.     Left Ear: Tympanic membrane, ear canal and external ear normal.     Nose: Nose normal.  Eyes:     Extraocular Movements: Extraocular movements intact.     Conjunctiva/sclera: Conjunctivae normal.     Pupils: Pupils are equal, round, and reactive to light.  Neck:     Thyroid : No thyroid  mass, thyromegaly or thyroid  tenderness.  Cardiovascular:     Rate and Rhythm: Normal rate and regular rhythm.     Pulses: Normal pulses.     Heart sounds: Normal heart sounds.  Pulmonary:     Effort: Pulmonary effort is normal.     Breath sounds: Normal breath sounds.  Abdominal:     General: Bowel sounds are normal.     Palpations: Abdomen is soft.  Musculoskeletal:        General: Normal range of motion.     Cervical back: Normal range of motion and neck supple.  Right lower leg: No edema.     Left lower leg: No edema.  Lymphadenopathy:     Cervical: No cervical adenopathy.  Skin:    General: Skin is warm and dry.  Neurological:     Mental Status: She is alert and oriented to person, place, and time.     Cranial Nerves: No cranial nerve deficit.  Psychiatric:        Mood and Affect: Mood normal.        Behavior: Behavior normal.        Thought Content: Thought content normal.      No results found for any visits on 09/07/23.    Assessment & Plan:    Routine Health Maintenance and Physical Exam  Immunization History  Administered Date(s) Administered    Influenza Inj Mdck Quad Pf 11/14/2017, 11/09/2018   Influenza, Seasonal, Injecte, Preservative Fre 10/26/2015   Influenza-Unspecified 10/26/2015, 10/27/2018   Pneumococcal Polysaccharide-23 03/12/2016   Tdap 11/18/2017    Health Maintenance  Topic Date Due   Hepatitis B Vaccines (1 of 3 - 19+ 3-dose series) Never done   INFLUENZA VACCINE  09/26/2023 (Originally 08/26/2023)   COVID-19 Vaccine (1 - 2024-25 season) 01/25/2024 (Originally 09/26/2022)   Pneumococcal Vaccine: 19-49 Years (2 of 2 - PCV) 09/06/2024 (Originally 03/12/2017)   Hepatitis C Screening  09/06/2024 (Originally 07/12/1994)   HIV Screening  09/06/2024 (Originally 07/12/1991)   Colonoscopy  06/18/2024   DTaP/Tdap/Td (2 - Td or Tdap) 11/19/2027   HPV VACCINES  Aged Out   Meningococcal B Vaccine  Aged Out    Discussed health benefits of physical activity, and encouraged her to engage in regular exercise appropriate for her age and condition.  Problem List Items Addressed This Visit     Hypercholesterolemia   encouraged to maintain a mediterranean diet and daily exercise Encouraged to quit tobacco use repeat lipid panel      Relevant Orders   Lipid panel   Tobacco use disorder, continuous   Smokes 1pack of cig per week Trigger: stress States she know she need to quit due to adverse effects, but has no plan or quit date at this time.      Other Visit Diagnoses       Encounter for preventative adult health care exam with abnormal findings    -  Primary   Relevant Orders   Comprehensive metabolic panel with GFR   TSH      Return in about 1 year (around 09/06/2024) for CPE (fasting).     Roselie Mood, NP

## 2023-09-07 NOTE — Assessment & Plan Note (Signed)
 encouraged to maintain a mediterranean diet and daily exercise Encouraged to quit tobacco use repeat lipid panel

## 2023-09-07 NOTE — Patient Instructions (Addendum)
 Go to lab Maintain Heart healthy diet and daily exercise. Have mammogram report faxed to me once completed.

## 2023-09-08 ENCOUNTER — Ambulatory Visit: Payer: Self-pay | Admitting: Nurse Practitioner

## 2023-09-27 ENCOUNTER — Other Ambulatory Visit: Payer: Self-pay

## 2023-10-01 ENCOUNTER — Other Ambulatory Visit: Payer: Self-pay | Admitting: Nurse Practitioner

## 2023-10-01 DIAGNOSIS — F411 Generalized anxiety disorder: Secondary | ICD-10-CM

## 2023-11-30 ENCOUNTER — Other Ambulatory Visit: Payer: Self-pay | Admitting: Medical Genetics

## 2023-11-30 DIAGNOSIS — Z006 Encounter for examination for normal comparison and control in clinical research program: Secondary | ICD-10-CM

## 2024-02-09 ENCOUNTER — Encounter: Payer: Self-pay | Admitting: Nurse Practitioner

## 2024-09-10 ENCOUNTER — Encounter: Admitting: Nurse Practitioner
